# Patient Record
Sex: Male | Born: 1995
Health system: Southern US, Community
[De-identification: ages and names within clinical notes are randomized; demographics above are authoritative.]

## PROBLEM LIST (undated history)

## (undated) DIAGNOSIS — J45909 Unspecified asthma, uncomplicated: Secondary | ICD-10-CM

## (undated) DIAGNOSIS — R51 Headache: Secondary | ICD-10-CM

## (undated) DIAGNOSIS — M549 Dorsalgia, unspecified: Secondary | ICD-10-CM

## (undated) DIAGNOSIS — R519 Headache, unspecified: Secondary | ICD-10-CM

## (undated) HISTORY — DX: Headache, unspecified: R51.9

## (undated) HISTORY — DX: Unspecified asthma, uncomplicated: J45.909

## (undated) HISTORY — DX: Headache: R51

## (undated) HISTORY — DX: Dorsalgia, unspecified: M54.9

---
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
## 2016-03-27 ENCOUNTER — Other Ambulatory Visit: Payer: Self-pay | Admitting: Family Medicine

## 2016-03-27 MED ORDER — ALBUTEROL SULFATE HFA 108 (90 BASE) MCG/ACT IN AERS: 2.0000 | INHALATION_SPRAY | RESPIRATORY_TRACT | 6 refills | Status: DC | PRN

## 2016-03-27 NOTE — Telephone Encounter (Signed)
Historical medication. Last seen 06/24/15. Please advise?

## 2016-04-30 ENCOUNTER — Encounter: Payer: 59 | Admitting: Family Medicine

## 2016-10-29 ENCOUNTER — Other Ambulatory Visit: Payer: Self-pay | Admitting: Family Medicine

## 2016-10-29 DIAGNOSIS — M62838 Other muscle spasm: Secondary | ICD-10-CM

## 2016-10-29 MED ORDER — GABAPENTIN 100 MG PO CAPS: 100.0000 mg | ORAL_CAPSULE | Freq: Three times a day (TID) | ORAL | 3 refills | Status: DC

## 2016-10-29 MED ORDER — CYCLOBENZAPRINE HCL 5 MG PO TABS: 5.0000 mg | ORAL_TABLET | Freq: Three times a day (TID) | ORAL | 0 refills | Status: DC | PRN

## 2017-12-20 ENCOUNTER — Other Ambulatory Visit: Payer: Self-pay | Admitting: Family Medicine

## 2017-12-20 DIAGNOSIS — M62838 Other muscle spasm: Secondary | ICD-10-CM

## 2017-12-20 MED ORDER — ESCITALOPRAM OXALATE 10 MG PO TABS: 10.0000 mg | ORAL_TABLET | Freq: Every day | ORAL | 1 refills | Status: DC

## 2017-12-20 MED ORDER — GABAPENTIN 100 MG PO CAPS: 100.0000 mg | ORAL_CAPSULE | Freq: Three times a day (TID) | ORAL | 3 refills | Status: DC

## 2018-05-26 ENCOUNTER — Other Ambulatory Visit: Payer: Self-pay | Admitting: Family Medicine

## 2018-05-26 MED ORDER — BECLOMETHASONE DIPROPIONATE 80 MCG/ACT IN AERS: 2.0000 | INHALATION_SPRAY | Freq: Two times a day (BID) | RESPIRATORY_TRACT | 12 refills | Status: DC

## 2018-05-26 MED ORDER — ALBUTEROL SULFATE HFA 108 (90 BASE) MCG/ACT IN AERS: 2.0000 | INHALATION_SPRAY | Freq: Four times a day (QID) | RESPIRATORY_TRACT | 11 refills | Status: DC | PRN

## 2018-09-17 ENCOUNTER — Other Ambulatory Visit: Payer: Self-pay | Admitting: Family Medicine

## 2018-09-17 MED ORDER — BUDESONIDE-FORMOTEROL FUMARATE 160-4.5 MCG/ACT IN AERO: 2.0000 | INHALATION_SPRAY | Freq: Two times a day (BID) | RESPIRATORY_TRACT | 3 refills | Status: DC

## 2018-10-09 ENCOUNTER — Other Ambulatory Visit: Payer: Self-pay

## 2018-10-09 MED ORDER — VALACYCLOVIR HCL 1 G PO TABS: 1000.0000 mg | ORAL_TABLET | Freq: Two times a day (BID) | ORAL | 2 refills | Status: DC

## 2018-10-22 NOTE — Progress Notes (Signed)
Subjective:    Christopher White is a 23 y.o. male who presents today for his Complete Annual Exam.    Current Outpatient Medications:  .  budesonide-formoterol (SYMBICORT) 160-4.5 MCG/ACT inhaler, Inhale 2 puffs into the lungs 2 (two) times daily., Disp: 1 Inhaler, Rfl: 3 .  albuterol (VENTOLIN HFA) 108 (90 Base) MCG/ACT inhaler, Inhale 2 puffs into the lungs every 6 (six) hours as needed for wheezing or shortness of breath., Disp: 18 g, Rfl: 3 .  Cetirizine HCl 10 MG CAPS, Take by mouth., Disp: , Rfl:  .  cyclobenzaprine (FLEXERIL) 5 MG tablet, Take 1 tablet (5 mg total) by mouth 3 (three) times daily as needed for muscle spasms., Disp: 20 tablet, Rfl: 0 .  gabapentin (NEURONTIN) 100 MG capsule, Take 1 capsule (100 mg total) by mouth 3 (three) times daily., Disp: 90 capsule, Rfl: 3 .  meloxicam (MOBIC) 15 MG tablet, Take 1 tablet (15 mg total) by mouth daily., Disp: 30 tablet, Rfl: 0 .  traZODone (DESYREL) 50 MG tablet, Take 0.5-1 tablets (25-50 mg total) by mouth at bedtime as needed for sleep., Disp: 30 tablet, Rfl: 3 .  triamcinolone (KENALOG) 0.1 % paste, Use as directed 1 application in the mouth or throat 2 (two) times daily., Disp: 5 g, Rfl: 12 .  valACYclovir (VALTREX) 1000 MG tablet, Take 1 tablet (1,000 mg total) by mouth 2 (two) times daily., Disp: 6 tablet, Rfl: 2  Health Maintenance Due  Topic Date Due  . TETANUS/TDAP  02/20/2016    PMHx, SurgHx, SocialHx, Medications, and Allergies were reviewed in the Visit Navigator and updated as appropriate.   Past Medical History:  Diagnosis Date  . Asthma   . Frequent headaches      Past Surgical History:  Procedure Laterality Date  . NECK SURGERY  2016   C1-C2 fusion for os odontoideum     Family History  Problem Relation Age of Onset  . Heart disease Father   . Hypertension Father   . Anemia Mother     Social History   Tobacco Use  . Smoking status: Current Some Day Smoker  . Smokeless tobacco: Former  Network engineer Use Topics  . Alcohol use: Yes    Alcohol/week: 0.0 standard drinks  . Drug use: No    Review of Systems:   Pertinent items are noted in the HPI. Otherwise, ROS is negative.  Objective:   Vitals:   10/24/18 1435  BP: 110/80  Pulse: 87  Temp: 98 F (36.7 C)  SpO2: 98%   Body mass index is 21.84 kg/m.  General Appearance:  Alert, cooperative, no distress, appears stated age  Head:  Normocephalic, without obvious abnormality, atraumatic  Eyes:  PERRL, conjunctiva/corneas clear, EOM's intact, fundi benign, both eyes       Ears:  Normal TM's and external ear canals, both ears  Nose: Nares normal, septum midline, mucosa normal, no drainage    or sinus tenderness  Throat: Lips, mucosa, and tongue normal; teeth and gums normal  Neck: Supple, symmetrical, trachea midline, no adenopathy; thyroid:  No enlargement/tenderness/nodules; no carotit bruit or JVD  Back:   Symmetric, no curvature, ROM normal, no CVA tenderness  Lungs:   Clear to auscultation bilaterally, respirations unlabored  Chest wall:  No tenderness or deformity  Heart:  Regular rate and rhythm, S1 and S2 normal, no murmur, rub   or gallop  Abdomen:   Soft, non-tender, bowel sounds active all four quadrants, no masses, no organomegaly  Extremities: Extremities normal, atraumatic, no cyanosis or edema  Prostate: Not done.   Skin: Skin color, texture, turgor normal, no rashes or lesions  Lymph nodes: Cervical, supraclavicular, and axillary nodes normal  Neurologic: CNII-XII grossly intact. Normal strength, sensation and reflexes throughout   Assessment/Plan:   Christopher White was seen today for annual exam.  Diagnoses and all orders for this visit:  Routine physical examination  Screening for STD (sexually transmitted disease) -     Cancel: CBC with Differential/Platelet -     Cancel: Comprehensive metabolic panel -     HIV Antibody (routine testing w rflx) -     RPR -     Cervicovaginal ancillary only(  [place]) -     HSV(herpes simplex vrs) 1+2 ab-IgG -     CBC with Differential/Platelet -     Comprehensive metabolic panel  Os odontoideum  Screening for lipid disorders -     Cancel: Lipid panel -     Lipid panel  Cough -     DG Chest 2 View  Primary insomnia -     traZODone (DESYREL) 50 MG tablet; Take 0.5-1 tablets (25-50 mg total) by mouth at bedtime as needed for sleep.  Neck muscle spasm -     meloxicam (MOBIC) 15 MG tablet; Take 1 tablet (15 mg total) by mouth daily. -     gabapentin (NEURONTIN) 100 MG capsule; Take 1 capsule (100 mg total) by mouth 3 (three) times daily. -     cyclobenzaprine (FLEXERIL) 5 MG tablet; Take 1 tablet (5 mg total) by mouth 3 (three) times daily as needed for muscle spasms.  Oral ulcer -     triamcinolone (KENALOG) 0.1 % paste; Use as directed 1 application in the mouth or throat 2 (two) times daily. -     valACYclovir (VALTREX) 1000 MG tablet; Take 1 tablet (1,000 mg total) by mouth 2 (two) times daily.  Moderate persistent asthma without complication -     albuterol (VENTOLIN HFA) 108 (90 Base) MCG/ACT inhaler; Inhale 2 puffs into the lungs every 6 (six) hours as needed for wheezing or shortness of breath.    Patient Counseling: [x]   Nutrition: Stressed importance of moderation in sodium/caffeine intake, saturated fat and cholesterol, caloric balance, sufficient intake of fresh fruits, vegetables, and fiber.  [x]   Stressed the importance of regular exercise.   []   Substance Abuse: Discussed cessation/primary prevention of tobacco, alcohol, or other drug use; driving or other dangerous activities under the influence; availability of treatment for abuse.   [x]   Injury prevention: Discussed safety belts, safety helmets, smoke detector, smoking near bedding or upholstery.   []   Sexuality: Discussed sexually transmitted diseases, partner selection, use of condoms, avoidance of unintended pregnancy and contraceptive alternatives.   [x]   Dental  health: Discussed importance of regular tooth brushing, flossing, and dental visits.  [x]   Health maintenance and immunizations reviewed. Please refer to Health maintenance section.    Christopher RimaErica Deliah Strehlow, DO [place] [facility]

## 2018-10-24 ENCOUNTER — Ambulatory Visit (INDEPENDENT_AMBULATORY_CARE_PROVIDER_SITE_OTHER): Payer: No Typology Code available for payment source | Admitting: Family Medicine

## 2018-10-24 ENCOUNTER — Other Ambulatory Visit: Payer: Self-pay

## 2018-10-24 ENCOUNTER — Encounter: Payer: Self-pay | Admitting: Family Medicine

## 2018-10-24 ENCOUNTER — Other Ambulatory Visit (HOSPITAL_COMMUNITY)
Admission: RE | Admit: 2018-10-24 | Discharge: 2018-10-24 | Disposition: A | Discharge status: Home / Self Care | Payer: No Typology Code available for payment source | Source: Ambulatory Visit | Attending: Family Medicine | Admitting: Family Medicine

## 2018-10-24 VITALS — BP 110/80 | HR 87 | Temp 98.0°F | Ht 72.0 in | Wt 161.0 lb

## 2018-10-24 DIAGNOSIS — Z113 Encounter for screening for infections with a predominantly sexual mode of transmission: Secondary | ICD-10-CM | POA: Insufficient documentation

## 2018-10-24 DIAGNOSIS — M438X1 Other specified deforming dorsopathies, occipito-atlanto-axial region: Secondary | ICD-10-CM | POA: Diagnosis not present

## 2018-10-24 DIAGNOSIS — R05 Cough: Secondary | ICD-10-CM

## 2018-10-24 DIAGNOSIS — F5101 Primary insomnia: Secondary | ICD-10-CM

## 2018-10-24 DIAGNOSIS — Z0000 Encounter for general adult medical examination without abnormal findings: Secondary | ICD-10-CM | POA: Diagnosis not present

## 2018-10-24 DIAGNOSIS — Z13220 Encounter for screening for lipoid disorders: Secondary | ICD-10-CM | POA: Diagnosis not present

## 2018-10-24 DIAGNOSIS — R059 Cough, unspecified: Secondary | ICD-10-CM

## 2018-10-24 DIAGNOSIS — K121 Other forms of stomatitis: Secondary | ICD-10-CM

## 2018-10-24 DIAGNOSIS — J4540 Moderate persistent asthma, uncomplicated: Secondary | ICD-10-CM

## 2018-10-24 DIAGNOSIS — M62838 Other muscle spasm: Secondary | ICD-10-CM

## 2018-10-24 MED ORDER — VALACYCLOVIR HCL 1 G PO TABS: 1000.0000 mg | ORAL_TABLET | Freq: Two times a day (BID) | ORAL | 2 refills | Status: DC

## 2018-10-24 MED ORDER — TRAZODONE HCL 50 MG PO TABS: 25.0000 mg | ORAL_TABLET | Freq: Every evening | ORAL | 3 refills | Status: DC | PRN

## 2018-10-24 MED ORDER — CYCLOBENZAPRINE HCL 5 MG PO TABS: 5.0000 mg | ORAL_TABLET | Freq: Three times a day (TID) | ORAL | 0 refills | Status: DC | PRN

## 2018-10-24 MED ORDER — TRIAMCINOLONE ACETONIDE 0.1 % MT PSTE: 1.0000 "application " | PASTE | Freq: Two times a day (BID) | OROMUCOSAL | 12 refills | Status: DC

## 2018-10-24 MED ORDER — ALBUTEROL SULFATE HFA 108 (90 BASE) MCG/ACT IN AERS: 2.0000 | INHALATION_SPRAY | Freq: Four times a day (QID) | RESPIRATORY_TRACT | 3 refills | Status: DC | PRN

## 2018-10-24 MED ORDER — MELOXICAM 15 MG PO TABS: 15.0000 mg | ORAL_TABLET | Freq: Every day | ORAL | 0 refills | Status: DC

## 2018-10-24 MED ORDER — GABAPENTIN 100 MG PO CAPS: 100.0000 mg | ORAL_CAPSULE | Freq: Three times a day (TID) | ORAL | 3 refills | Status: DC

## 2018-10-25 ENCOUNTER — Encounter: Payer: Self-pay | Admitting: Family Medicine

## 2018-10-25 LAB — CBC WITH DIFFERENTIAL/PLATELET
Absolute Monocytes: 390 cells/uL (ref 200–950)
Basophils Absolute: 18 cells/uL (ref 0–200)
Basophils Relative: 0.3 %
Eosinophils Absolute: 198 cells/uL (ref 15–500)
Eosinophils Relative: 3.3 %
HCT: 47 % (ref 38.5–50.0)
Hemoglobin: 15.8 g/dL (ref 13.2–17.1)
Lymphs Abs: 1452 cells/uL (ref 850–3900)
MCH: 29.8 pg (ref 27.0–33.0)
MCHC: 33.6 g/dL (ref 32.0–36.0)
MCV: 88.5 fL (ref 80.0–100.0)
MPV: 10.5 fL (ref 7.5–12.5)
Monocytes Relative: 6.5 %
Neutro Abs: 3942 cells/uL (ref 1500–7800)
Neutrophils Relative %: 65.7 %
Platelets: 221 10*3/uL (ref 140–400)
RBC: 5.31 10*6/uL (ref 4.20–5.80)
RDW: 12.4 % (ref 11.0–15.0)
Total Lymphocyte: 24.2 %
WBC: 6 10*3/uL (ref 3.8–10.8)

## 2018-10-25 LAB — COMPREHENSIVE METABOLIC PANEL
AG Ratio: 2 (calc) (ref 1.0–2.5)
ALT: 16 U/L (ref 9–46)
AST: 22 U/L (ref 10–40)
Albumin: 4.5 g/dL (ref 3.6–5.1)
Alkaline phosphatase (APISO): 68 U/L (ref 36–130)
BUN: 19 mg/dL (ref 7–25)
CO2: 25 mmol/L (ref 20–32)
Calcium: 9.6 mg/dL (ref 8.6–10.3)
Chloride: 104 mmol/L (ref 98–110)
Creat: 1.02 mg/dL (ref 0.60–1.35)
Globulin: 2.2 g/dL (calc) (ref 1.9–3.7)
Glucose, Bld: 88 mg/dL (ref 65–99)
Potassium: 4.1 mmol/L (ref 3.5–5.3)
Sodium: 140 mmol/L (ref 135–146)
Total Bilirubin: 0.5 mg/dL (ref 0.2–1.2)
Total Protein: 6.7 g/dL (ref 6.1–8.1)

## 2018-10-25 LAB — LIPID PANEL
Cholesterol: 136 mg/dL (ref <200)
HDL: 34 mg/dL — ABNORMAL LOW (ref >=40)
LDL Cholesterol (Calc): 78 mg/dL (calc)
Non-HDL Cholesterol (Calc): 102 mg/dL (calc) (ref <130)
Total CHOL/HDL Ratio: 4 (calc) (ref <5.0)
Triglycerides: 137 mg/dL (ref <150)

## 2018-10-27 LAB — HSV(HERPES SIMPLEX VRS) I + II AB-IGG
HAV 1 IGG,TYPE SPECIFIC AB: 47.6 index — ABNORMAL HIGH
HSV 2 IGG,TYPE SPECIFIC AB: <0.9 index

## 2018-10-27 LAB — RPR: RPR Ser Ql: NONREACTIVE

## 2018-10-27 LAB — HIV ANTIBODY (ROUTINE TESTING W REFLEX): HIV 1&2 Ab, 4th Generation: NONREACTIVE

## 2018-10-28 LAB — CERVICOVAGINAL ANCILLARY ONLY
Chlamydia: NEGATIVE
Neisseria Gonorrhea: NEGATIVE
Trichomonas: NEGATIVE

## 2018-12-22 ENCOUNTER — Other Ambulatory Visit: Payer: Self-pay | Admitting: Family Medicine

## 2018-12-22 DIAGNOSIS — K121 Other forms of stomatitis: Secondary | ICD-10-CM

## 2018-12-22 MED ORDER — VALACYCLOVIR HCL 1 G PO TABS: 1000.0000 mg | ORAL_TABLET | Freq: Two times a day (BID) | ORAL | 11 refills | Status: DC

## 2019-01-19 ENCOUNTER — Other Ambulatory Visit: Payer: Self-pay

## 2019-01-19 DIAGNOSIS — Z20822 Contact with and (suspected) exposure to covid-19: Secondary | ICD-10-CM

## 2019-01-20 LAB — NOVEL CORONAVIRUS, NAA: SARS-CoV-2, NAA: NOT DETECTED

## 2019-03-06 ENCOUNTER — Other Ambulatory Visit: Payer: Self-pay

## 2019-03-06 ENCOUNTER — Emergency Department
Admission: EM | Admit: 2019-03-06 | Discharge: 2019-03-06 | Disposition: A | Discharge status: Home / Self Care | Payer: No Typology Code available for payment source | Attending: Emergency Medicine | Admitting: Emergency Medicine

## 2019-03-06 DIAGNOSIS — M436 Torticollis: Secondary | ICD-10-CM | POA: Insufficient documentation

## 2019-03-06 DIAGNOSIS — J45909 Unspecified asthma, uncomplicated: Secondary | ICD-10-CM | POA: Diagnosis not present

## 2019-03-06 DIAGNOSIS — F17210 Nicotine dependence, cigarettes, uncomplicated: Secondary | ICD-10-CM | POA: Diagnosis not present

## 2019-03-06 DIAGNOSIS — M542 Cervicalgia: Secondary | ICD-10-CM | POA: Diagnosis present

## 2019-03-06 DIAGNOSIS — Z79899 Other long term (current) drug therapy: Secondary | ICD-10-CM | POA: Insufficient documentation

## 2019-03-06 MED ORDER — OXYCODONE-ACETAMINOPHEN 5-325 MG PO TABS
1.0000 | ORAL_TABLET | Freq: Once | ORAL | Status: AC
  Administered 2019-03-06: 1 via ORAL
  Filled 2019-03-06: qty 1

## 2019-03-06 MED ORDER — IBUPROFEN 800 MG PO TABS: 800.0000 mg | ORAL_TABLET | Freq: Three times a day (TID) | ORAL | 0 refills | Status: DC | PRN

## 2019-03-06 MED ORDER — DIAZEPAM 2 MG PO TABS
2.0000 mg | ORAL_TABLET | Freq: Once | ORAL | Status: AC
  Administered 2019-03-06: 06:00:00 2 mg via ORAL
  Filled 2019-03-06: qty 1

## 2019-03-06 MED ORDER — DIAZEPAM 2 MG PO TABS: 2.0000 mg | ORAL_TABLET | Freq: Three times a day (TID) | ORAL | 0 refills | Status: DC | PRN

## 2019-03-06 MED ORDER — OXYCODONE-ACETAMINOPHEN 5-325 MG PO TABS: 1.0000 | ORAL_TABLET | ORAL | 0 refills | Status: DC | PRN

## 2019-03-06 MED ORDER — KETOROLAC TROMETHAMINE 30 MG/ML IJ SOLN
30.0000 mg | Freq: Once | INTRAMUSCULAR | Status: AC
  Administered 2019-03-06: 30 mg via INTRAMUSCULAR
  Filled 2019-03-06: qty 1

## 2019-03-06 NOTE — ED Triage Notes (Signed)
Pt states neck pain began this AM. States played football yesterday but denies falling or getting hit. States "i'm pretty active, I usually play baseball." A&O, ambulatory. Holding neck at this time. Hx neck surgery either in 2015 or 2017.

## 2019-03-06 NOTE — Discharge Instructions (Signed)
1. You may take medicines as needed for pain and muscle spasms (Motrin/Percocet/Valium #15). °2. Apply moist heat to affected area several times daily. °3. Return to the ER for worsening symptoms, persistent vomiting, difficulty breathing or other concerns. °

## 2019-03-06 NOTE — ED Notes (Signed)
Spoke to Dr. Beather Arbour about pt presentation. No blood work at this time.
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
## 2019-04-14 ENCOUNTER — Other Ambulatory Visit: Payer: Self-pay | Admitting: Family Medicine

## 2019-04-14 DIAGNOSIS — J4540 Moderate persistent asthma, uncomplicated: Secondary | ICD-10-CM

## 2019-04-14 MED ORDER — BUDESONIDE-FORMOTEROL FUMARATE 160-4.5 MCG/ACT IN AERO: 2.0000 | INHALATION_SPRAY | Freq: Two times a day (BID) | RESPIRATORY_TRACT | 11 refills | Status: DC

## 2019-04-14 MED ORDER — ALBUTEROL SULFATE HFA 108 (90 BASE) MCG/ACT IN AERS: 2.0000 | INHALATION_SPRAY | Freq: Four times a day (QID) | RESPIRATORY_TRACT | 11 refills | Status: DC | PRN

## 2019-04-25 ENCOUNTER — Emergency Department
Admission: EM | Admit: 2019-04-25 | Discharge: 2019-04-25 | Disposition: A | Discharge status: Home / Self Care | Payer: No Typology Code available for payment source | Attending: Emergency Medicine | Admitting: Emergency Medicine

## 2019-04-25 ENCOUNTER — Emergency Department: Payer: No Typology Code available for payment source

## 2019-04-25 ENCOUNTER — Other Ambulatory Visit: Payer: Self-pay

## 2019-04-25 DIAGNOSIS — Z79899 Other long term (current) drug therapy: Secondary | ICD-10-CM | POA: Insufficient documentation

## 2019-04-25 DIAGNOSIS — X500XXA Overexertion from strenuous movement or load, initial encounter: Secondary | ICD-10-CM | POA: Insufficient documentation

## 2019-04-25 DIAGNOSIS — F17200 Nicotine dependence, unspecified, uncomplicated: Secondary | ICD-10-CM | POA: Diagnosis not present

## 2019-04-25 DIAGNOSIS — J45909 Unspecified asthma, uncomplicated: Secondary | ICD-10-CM | POA: Insufficient documentation

## 2019-04-25 DIAGNOSIS — Y999 Unspecified external cause status: Secondary | ICD-10-CM | POA: Diagnosis not present

## 2019-04-25 DIAGNOSIS — Y9389 Activity, other specified: Secondary | ICD-10-CM | POA: Diagnosis not present

## 2019-04-25 DIAGNOSIS — T148XXA Other injury of unspecified body region, initial encounter: Secondary | ICD-10-CM

## 2019-04-25 DIAGNOSIS — Y9289 Other specified places as the place of occurrence of the external cause: Secondary | ICD-10-CM | POA: Diagnosis not present

## 2019-04-25 DIAGNOSIS — S3992XA Unspecified injury of lower back, initial encounter: Secondary | ICD-10-CM | POA: Diagnosis present

## 2019-04-25 DIAGNOSIS — M4306 Spondylolysis, lumbar region: Secondary | ICD-10-CM | POA: Insufficient documentation

## 2019-04-25 DIAGNOSIS — S39012A Strain of muscle, fascia and tendon of lower back, initial encounter: Secondary | ICD-10-CM | POA: Diagnosis not present

## 2019-04-25 MED ORDER — KETOROLAC TROMETHAMINE 30 MG/ML IJ SOLN
30.0000 mg | Freq: Once | INTRAMUSCULAR | Status: AC
  Administered 2019-04-25: 30 mg via INTRAMUSCULAR
  Filled 2019-04-25: qty 1

## 2019-04-25 MED ORDER — METHOCARBAMOL 500 MG PO TABS: 500.0000 mg | ORAL_TABLET | Freq: Four times a day (QID) | ORAL | 0 refills | Status: DC

## 2019-04-25 MED ORDER — ONDANSETRON 4 MG PO TBDP
4.0000 mg | ORAL_TABLET | Freq: Once | ORAL | Status: AC
  Administered 2019-04-25: 4 mg via ORAL
  Filled 2019-04-25: qty 1

## 2019-04-25 MED ORDER — CYCLOBENZAPRINE HCL 10 MG PO TABS
10.0000 mg | ORAL_TABLET | Freq: Once | ORAL | Status: AC
  Administered 2019-04-25: 10 mg via ORAL
  Filled 2019-04-25: qty 1

## 2019-04-25 NOTE — Discharge Instructions (Addendum)
Follow-up with your regular doctor if not better in 3 to 5 days.  Use the medication as prescribed.  Take Tylenol and ibuprofen with this medication to decrease the amount of pain.  Return if worsening.

## 2019-04-25 NOTE — ED Provider Notes (Signed)
[facility] Emergency Department Provider Note  ____________________________________________   First MD Initiated Contact with Patient 04/25/19 1449     (approximate)  I have reviewed the triage vital signs and the nursing notes.   HISTORY  Chief Complaint Back Pain    HPI Christopher White is a 24 y.o. male presents emergency department complaining of low back pain.  States he was picking up trash bags at work and felt a sharp pain.  When he got in his car got worse.  He states it is bringing him to his knees it hurts so bad.  No numbness or tingling.  No fall.    Past Medical History:  Diagnosis Date  . Asthma   . Frequent headaches     Patient Active Problem List   Diagnosis Date Noted  . 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 Screening for STD (sexually transmitted disease) 06/24/2015    Past Surgical History:  Procedure Laterality Date  . NECK SURGERY  2016   C1-C2 fusion for os odontoideum    Prior to Admission medications   Medication Sig Start Date End Date Taking? Authorizing Provider  albuterol (VENTOLIN HFA) 108 (90 Base) MCG/ACT inhaler Inhale 2 puffs into the lungs every 6 (six) hours as needed for wheezing or shortness of breath. 04/14/19   Briscoe Deutscher, DO  budesonide-formoterol (SYMBICORT) 160-4.5 MCG/ACT inhaler Inhale 2 puffs into the lungs 2 (two) times daily. 04/14/19   Briscoe Deutscher, DO  Cetirizine HCl 10 MG CAPS Take by mouth.    [provider]  cyclobenzaprine (FLEXERIL) 5 MG tablet Take 1 tablet (5 mg total) by mouth 3 (three) times daily as needed for muscle spasms. 10/24/18   Briscoe Deutscher, DO  diazepam (VALIUM) 2 MG tablet Take 1 tablet (2 mg total) by mouth every 8 (eight) hours as needed for muscle spasms. 03/06/19   Paulette Blanch, MD  gabapentin (NEURONTIN) 100 MG capsule Take 1 capsule (100 mg total) by mouth 3 (three) times daily. 10/24/18   Briscoe Deutscher, DO  ibuprofen (ADVIL) 800 MG tablet Take 1 tablet (800  mg total) by mouth every 8 (eight) hours as needed for moderate pain. 03/06/19   Paulette Blanch, MD  meloxicam (MOBIC) 15 MG tablet Take 1 tablet (15 mg total) by mouth daily. 10/24/18   Briscoe Deutscher, DO  methocarbamol (ROBAXIN) 500 MG tablet Take 1 tablet (500 mg total) by mouth 4 (four) times daily. 04/25/19   Akash Winski, Linden Dolin, PA-C  oxyCODONE-acetaminophen (PERCOCET/ROXICET) 5-325 MG tablet Take 1 tablet by mouth every 4 (four) hours as needed for severe pain. 03/06/19   Paulette Blanch, MD  traZODone (DESYREL) 50 MG tablet Take 0.5-1 tablets (25-50 mg total) by mouth at bedtime as needed for sleep. 10/24/18   Briscoe Deutscher, DO  triamcinolone (KENALOG) 0.1 % paste Use as directed 1 application in the mouth or throat 2 (two) times daily. 10/24/18   Briscoe Deutscher, DO  valACYclovir (VALTREX) 1000 MG tablet Take 1 tablet (1,000 mg total) by mouth 2 (two) times daily. 12/22/18   Briscoe Deutscher, DO    Allergies Patient has no known allergies.  Family History  Problem Relation Age of Onset  . Heart disease Father   . Hypertension Father   . Anemia Mother     Social History Social History   Tobacco Use  . Smoking status: Current Some Day Smoker  . Smokeless tobacco: Former Network engineer Use Topics  . Alcohol use: Yes  Alcohol/week: 0.0 standard drinks  . Drug use: No    Review of Systems  Constitutional: No fever/chills Eyes: No visual changes. ENT: No sore throat. Respiratory: Denies cough Cardiovascular: Denies chest pain Gastrointestinal: Denies abdominal pain Genitourinary: Negative for dysuria. Musculoskeletal: Positive for back pain. Skin: Negative for rash. Psychiatric: no mood changes,     ____________________________________________   PHYSICAL EXAM:  VITAL SIGNS: ED Triage Vitals  Enc Vitals Group     BP 04/25/19 1404 106/71     Pulse Rate 04/25/19 1404 65     Resp 04/25/19 1404 18     Temp 04/25/19 1404 97.7 F (36.5 C)     Temp Source 04/25/19 1404 Oral      SpO2 04/25/19 1404 100 %     Weight 04/25/19 1406 170 lb (77.1 kg)     Height 04/25/19 1406 6' (1.829 m)     Head Circumference --      Peak Flow --      Pain Score 04/25/19 1406 8     Pain Loc --      Pain Edu? --      Excl. in GC? --     Constitutional: Alert and oriented. Well appearing and in no acute distress. Eyes: Conjunctivae are normal.  Head: Atraumatic. Nose: No congestion/rhinnorhea. Mouth/Throat: Mucous membranes are moist.   Neck:  supple no lymphadenopathy noted Cardiovascular: Normal rate, regular rhythm. Respiratory: Normal respiratory effort.  No retractions,  GU: deferred Musculoskeletal: FROM all extremities, warm and well perfused, large spasm noted up the left side of the lower back, spine is mildly tender, neurovascular is intact, pain is reproduced with any movement. Neurologic:  Normal speech and language.  Skin:  Skin is warm, dry and intact. No rash noted. Psychiatric: Mood and affect are normal. Speech and behavior are normal.  ____________________________________________   LABS (all labs ordered are listed, but only abnormal results are displayed)  Labs Reviewed - No data to display ____________________________________________   ____________________________________________  RADIOLOGY  X-ray of the lumbar spine is negative  ____________________________________________   PROCEDURES  Procedure(s) performed: Toradol 30 mg IM, Flexeril 1 p.o.  Procedures    ____________________________________________   INITIAL IMPRESSION / ASSESSMENT AND PLAN / ED COURSE  Pertinent labs & imaging results that were available during my care of the patient were reviewed by me and considered in my medical decision making (see chart for details).   Patient is 24 year old male presents emergency department complaining of low back pain.  See HPI  Physical exam patient appears to be in fair amount of pain.  Area is tender to palpation.  Discussed  findings with patient.  We will do a x-ray, he was given Toradol 30 mg IM and Flexeril 1 p.o.  X-ray of the lumbar spine is negative  Explained the x-ray results to the patient.  He is to follow-up with orthopedics or his regular doctor if not better in 3 to 5 days.  He was given prescription for Robaxin.  Take Tylenol and ibuprofen for pain as needed.  Use wet heat to massage the muscles.  Return if worsening.  States he understands will comply.  Is discharged stable condition.    Carry Chehalis Bazen was evaluated in Emergency Department on 04/25/2019 for the symptoms described in the history of present illness. He was evaluated in the context of the global COVID-19 pandemic, which necessitated consideration that the patient might be at risk for infection with the SARS-CoV-2 virus that causes COVID-19. Institutional protocols  and algorithms that pertain to the evaluation of patients at risk for COVID-19 are in a state of rapid change based on information released by regulatory bodies including the CDC and federal and state organizations. These policies and algorithms were followed during the patient's care in the ED.   As part of my medical decision making, I reviewed the following data within the electronic MEDICAL RECORD NUMBER Nursing notes reviewed and incorporated, Old chart reviewed, Radiograph reviewed see above, Notes from prior ED visits and [place] Controlled Substance Database  ____________________________________________   FINAL CLINICAL IMPRESSION(S) / ED DIAGNOSES  Final diagnoses:  Muscle strain      NEW MEDICATIONS STARTED DURING THIS VISIT:  New Prescriptions   METHOCARBAMOL (ROBAXIN) 500 MG TABLET    Take 1 tablet (500 mg total) by mouth 4 (four) times daily.     Note:  This document was prepared using Dragon voice recognition software and may include unintentional dictation errors.    Faythe Ghee, PA-C 04/25/19 1549    Phineas Semen, MD 04/26/19 [phone]

## 2019-04-25 NOTE — ED Triage Notes (Signed)
Pt c/o left lower back pain that started while lifting , pt is clammy on arrival.

## 2019-04-29 ENCOUNTER — Ambulatory Visit
Admission: EM | Admit: 2019-04-29 | Discharge: 2019-04-29 | Disposition: A | Discharge status: Home / Self Care | Payer: No Typology Code available for payment source | Attending: Family Medicine | Admitting: Family Medicine

## 2019-04-29 ENCOUNTER — Encounter: Payer: Self-pay | Admitting: Emergency Medicine

## 2019-04-29 ENCOUNTER — Other Ambulatory Visit: Payer: Self-pay

## 2019-04-29 DIAGNOSIS — R519 Headache, unspecified: Secondary | ICD-10-CM | POA: Diagnosis not present

## 2019-04-29 DIAGNOSIS — J45901 Unspecified asthma with (acute) exacerbation: Secondary | ICD-10-CM | POA: Diagnosis not present

## 2019-04-29 DIAGNOSIS — Z87891 Personal history of nicotine dependence: Secondary | ICD-10-CM | POA: Diagnosis not present

## 2019-04-29 DIAGNOSIS — Z20822 Contact with and (suspected) exposure to covid-19: Secondary | ICD-10-CM | POA: Insufficient documentation

## 2019-04-29 DIAGNOSIS — Z8249 Family history of ischemic heart disease and other diseases of the circulatory system: Secondary | ICD-10-CM | POA: Diagnosis not present

## 2019-04-29 DIAGNOSIS — Z832 Family history of diseases of the blood and blood-forming organs and certain disorders involving the immune mechanism: Secondary | ICD-10-CM | POA: Insufficient documentation

## 2019-04-29 DIAGNOSIS — Z7189 Other specified counseling: Secondary | ICD-10-CM

## 2019-04-29 DIAGNOSIS — J069 Acute upper respiratory infection, unspecified: Secondary | ICD-10-CM | POA: Diagnosis not present

## 2019-04-29 DIAGNOSIS — R05 Cough: Secondary | ICD-10-CM | POA: Diagnosis not present

## 2019-04-29 DIAGNOSIS — R0602 Shortness of breath: Secondary | ICD-10-CM | POA: Diagnosis present

## 2019-04-29 LAB — SARS CORONAVIRUS 2 AG (30 MIN TAT): SARS Coronavirus 2 Ag: NEGATIVE

## 2019-04-29 MED ORDER — PREDNISONE 20 MG PO TABS: 40.0000 mg | ORAL_TABLET | Freq: Every day | ORAL | 0 refills | Status: DC

## 2019-04-29 MED ORDER — BENZONATATE 100 MG PO CAPS: 100.0000 mg | ORAL_CAPSULE | Freq: Three times a day (TID) | ORAL | 0 refills | Status: DC | PRN

## 2019-04-29 MED ORDER — ALBUTEROL SULFATE HFA 108 (90 BASE) MCG/ACT IN AERS: 2.0000 | INHALATION_SPRAY | RESPIRATORY_TRACT | 0 refills | Status: DC | PRN

## 2019-04-29 NOTE — ED Triage Notes (Signed)
Patient c/o SOB, headache, sneezing and cough that started 3 days ago. He has had a positive exposure 6 days ago.

## 2019-04-29 NOTE — Discharge Instructions (Signed)
Take medication as prescribed. Over the counter congestion medication as needed. Rest. Drink plenty of fluids.   Follow up with your primary care physician this week as needed. Return to Urgent care for new or worsening concerns.

## 2019-04-29 NOTE — ED Provider Notes (Signed)
[facility] ____________________________________________  Time seen: Approximately 5:32 PM  I have reviewed the triage vital signs and the nursing notes.   HISTORY  Chief Complaint Shortness of Breath, Headache, and Cough   HPI Christopher White is a 24 y.o. male presenting for evaluation of 4 days of runny nose, nasal congestion and cough.  Reports some body aches, denies fevers.  States he has occasionally heard himself wheeze, consistent with his previous asthma flareups.  Did use his albuterol inhaler this morning which helps some.  Denies changes in taste or smell, diarrhea.  1 episode of vomiting this past weekend.  Denies abdominal pain.  Does have some intermittent shortness of breath with chest tightness, consistent with his previous asthma flareups.  Denies chest pain.  Did take some over-the-counter cough congestion medication which helped minimally.  Reports to exposure to COVID-19 in the last week, 1 from her physicals 1 from his girlfriends died.  Denies other aggravating or alleviating factors.    Past Medical History:  Diagnosis Date  . Asthma   . Frequent headaches     Patient Active Problem List   Diagnosis Date Noted  . 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 Screening for STD (sexually transmitted disease) 06/24/2015    Past Surgical History:  Procedure Laterality Date  . NECK SURGERY  2016   C1-C2 fusion for os odontoideum     No current facility-administered medications for this encounter.  Current Outpatient Medications:  .  albuterol (VENTOLIN HFA) 108 (90 Base) MCG/ACT inhaler, Inhale 2 puffs into the lungs every 6 (six) hours as needed for wheezing or shortness of breath., Disp: 18 g, Rfl: 11 .  albuterol (VENTOLIN HFA) 108 (90 Base) MCG/ACT inhaler, Inhale 2 puffs into the lungs every 4 (four) hours as needed., Disp: 6.7 g, Rfl: 0 .  benzonatate (TESSALON PERLES) 100 MG capsule, Take 1 capsule (100 mg total) by mouth 3 (three) times daily  as needed for cough., Disp: 15 capsule, Rfl: 0 .  predniSONE (DELTASONE) 20 MG tablet, Take 2 tablets (40 mg total) by mouth daily., Disp: 10 tablet, Rfl: 0  Allergies Patient has no known allergies.  Family History  Problem Relation Age of Onset  . Heart disease Father   . Hypertension Father   . Anemia Mother     Social History Social History   Tobacco Use  . Smoking status: Former Research scientist (life sciences)  . Smokeless tobacco: Former Network engineer Use Topics  . Alcohol use: Yes    Alcohol/week: 0.0 standard drinks  . Drug use: No    Review of Systems Constitutional: No fever ENT: Reports some scratchy throat.  Positive nasal congestion. Cardiovascular: Denies chest pain. Respiratory: As above. Gastrointestinal: No abdominal pain.  Genitourinary: Negative for dysuria. Musculoskeletal: Negative for back pain. Skin: Negative for rash.   ____________________________________________   PHYSICAL EXAM:  VITAL SIGNS: ED Triage Vitals  Enc Vitals Group     BP 04/29/19 1645 120/88     Pulse Rate 04/29/19 1645 (!) 112     Resp 04/29/19 1645 18     Temp 04/29/19 1645 98.1 F (36.7 C)     Temp Source 04/29/19 1645 Oral     SpO2 04/29/19 1645 99 %     Weight 04/29/19 1641 170 lb (77.1 kg)     Height 04/29/19 1641 6' (1.829 m)     Head Circumference --      Peak Flow --      Pain Score 04/29/19 1640 3  Pain Loc --      Pain Edu? --      Excl. in GC? --     Constitutional: Alert and oriented. Well appearing and in no acute distress. Eyes: Conjunctivae are normal. Head: Atraumatic. No swelling. No erythema.  Nose:Nasal congestion   Mouth/Throat: Mucous membranes are moist. No pharyngeal erythema. No tonsillar swelling or exudate.  Neck: No stridor.  No cervical spine tenderness to palpation. Hematological/Lymphatic/Immunilogical: No cervical lymphadenopathy. Cardiovascular: Normal rate, regular rhythm. Grossly normal heart sounds.  Good peripheral circulation. Respiratory:  Normal respiratory effort.  No retractions.  No rhonchi.  Occasional wheeze.  Dry intermittent cough in room with mild bronchospasm.  Good air movement.  Speaks in complete sentences. Musculoskeletal: Ambulatory with steady gait.  Neurologic:  Normal speech and language. No gait instability. Skin:  Skin appears warm, dry and intact. No rash noted. Psychiatric: Mood and affect are normal. Speech and behavior are normal.  ___________________________________________   LABS (all labs ordered are listed, but only abnormal results are displayed)  Labs Reviewed  SARS CORONAVIRUS 2 AG (30 MIN TAT)  NOVEL CORONAVIRUS, NAA (HOSP ORDER, SEND-OUT TO REF LAB; TAT 18-24 HRS)    PROCEDURES Procedures     INITIAL IMPRESSION / ASSESSMENT AND PLAN / ED COURSE  Pertinent labs & imaging results that were available during my care of the patient were reviewed by me and considered in my medical decision making (see chart for details).  Overall well-appearing patient.  No acute distress.  Suspect viral illness with acute asthma exacerbation.  Rapid Covid test negative, PCR sent.  COVID-19 advice given.  Will refill albuterol inhaler, 5-day course of prednisone prn Tessalon Perles.  Continue over-the-counter decongestants.  Discussed rest, fluids, remaining home and supportive care.  Discussed reevaluation concerns.  Discussed evaluation of chest x-ray today in urgent care, patient declined, stating he will return if symptoms persist.Discussed indication, risks and benefits of medications with patient.   Discussed follow up with Primary care physician this week. Discussed follow up and return parameters including no resolution or any worsening concerns. Patient verbalized understanding and agreed to plan.   ____________________________________________   FINAL CLINICAL IMPRESSION(S) / ED DIAGNOSES  Final diagnoses:  Upper respiratory tract infection, unspecified type  Advice given about COVID-19 virus  infection  Asthma with acute exacerbation, unspecified asthma severity, unspecified whether persistent     ED Discharge Orders         Ordered    predniSONE (DELTASONE) 20 MG tablet  Daily     04/29/19 1716    albuterol (VENTOLIN HFA) 108 (90 Base) MCG/ACT inhaler  Every 4 hours PRN     04/29/19 1716    benzonatate (TESSALON PERLES) 100 MG capsule  3 times daily PRN     04/29/19 1716           Note: This dictation was prepared with Dragon dictation along with smaller phrase technology. Any transcriptional errors that result from this process are unintentional.         Renford Dills, NP 04/29/19 1740

## 2019-04-30 LAB — NOVEL CORONAVIRUS, NAA (HOSP ORDER, SEND-OUT TO REF LAB; TAT 18-24 HRS): SARS-CoV-2, NAA: NOT DETECTED

## 2019-06-01 ENCOUNTER — Encounter: Payer: Self-pay | Admitting: Internal Medicine

## 2019-06-01 ENCOUNTER — Other Ambulatory Visit: Payer: Self-pay

## 2019-06-01 ENCOUNTER — Ambulatory Visit (INDEPENDENT_AMBULATORY_CARE_PROVIDER_SITE_OTHER): Payer: No Typology Code available for payment source | Admitting: Internal Medicine

## 2019-06-01 VITALS — BP 104/68 | HR 80 | Temp 98.1°F | Ht 72.0 in | Wt 167.0 lb

## 2019-06-01 DIAGNOSIS — M62830 Muscle spasm of back: Secondary | ICD-10-CM | POA: Diagnosis not present

## 2019-06-01 DIAGNOSIS — J4540 Moderate persistent asthma, uncomplicated: Secondary | ICD-10-CM

## 2019-06-01 MED ORDER — METAXALONE 400 MG PO TABS: 400.0000 mg | ORAL_TABLET | Freq: Three times a day (TID) | ORAL | 0 refills | Status: AC

## 2019-06-01 MED ORDER — ALBUTEROL SULFATE HFA 108 (90 BASE) MCG/ACT IN AERS: 2.0000 | INHALATION_SPRAY | Freq: Four times a day (QID) | RESPIRATORY_TRACT | 11 refills | Status: DC | PRN

## 2019-06-01 NOTE — Progress Notes (Signed)
Date:  06/01/2019   Name:  Christopher White [facility]   DOB:  Feb 17, 1996   MRN:  [id number]   Chief Complaint: Establish Care (New patient.), Back Pain (Having to take muscle relaxer to make the pain bareable- does not solve the problem or take the pain fully away. ), and Asthma ([facility] [place]. )  Back Pain This is a recurrent problem. The problem occurs every several days. The pain is present in the lumbar spine (muscle spasms on the right lumbar region). The quality of the pain is described as aching. The pain does not radiate. The pain is moderate. Exacerbated by: seems to be worse after work. Pertinent negatives include no abdominal pain, bladder incontinence, bowel incontinence, chest pain, fever, headaches, pelvic pain or tingling. He has tried muscle relaxant, ice and heat (tried robaxin, flexeril; recently added vitamin/mineral supplement, stopped soda and began drinking more water and electrolytes) for the symptoms. The treatment provided mild relief.  Asthma He complains of wheezing. There is no cough. This is a recurrent problem. The problem occurs daily. The problem has been unchanged. Associated symptoms include myalgias. Pertinent negatives include no chest pain, fever, headaches or trouble swallowing. His symptoms are alleviated by beta-agonist (has tried maintenance therapy in the past but stopped due to percieved lack of benefit). His past medical history is significant for asthma.    Lab Results  Component Value Date   CREATININE 1.02 10/24/2018   BUN 19 10/24/2018   NA 140 10/24/2018   K 4.1 10/24/2018   CL 104 10/24/2018   CO2 25 10/24/2018   Lab Results  Component Value Date   CHOL 136 10/24/2018   HDL 34 (L) 10/24/2018   LDLCALC 78 10/24/2018   TRIG 137 10/24/2018   CHOLHDL 4.0 10/24/2018   No results found for: TSH No results found for: HGBA1C   Review of Systems  Constitutional: Negative for chills, fatigue, fever and unexpected weight change.  HENT:  Negative for trouble swallowing.   Respiratory: Positive for wheezing. Negative for cough and chest tightness.   Cardiovascular: Negative for chest pain, palpitations and leg swelling.  Gastrointestinal: Negative for abdominal pain and bowel incontinence.  Genitourinary: Negative for bladder incontinence and pelvic pain.  Musculoskeletal: Positive for back pain and myalgias. Negative for gait problem and joint swelling.  Neurological: Negative for dizziness, tingling and headaches.  Psychiatric/Behavioral: Negative for dysphoric mood and sleep disturbance.    Patient Active Problem List   Diagnosis Date Noted  . 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 Screening for STD (sexually transmitted disease) 06/24/2015    No Known Allergies  Past Surgical History:  Procedure Laterality Date  . NECK SURGERY  2016   C1-C2 fusion for os odontoideum    Social History   Tobacco Use  . Smoking status: Former Smoker    Packs/day: 1.50    Years: 1.50    Pack years: 2.25    Types: Cigarettes    Quit date: 2016    Years since quitting: 5.1  . Smokeless tobacco: Former Systems developer    Types: [place] date: 2016  Substance Use Topics  . Alcohol use: Yes    Alcohol/week: 0.0 standard drinks  . Drug use: No     Medication list has been reviewed and updated.  Current Meds  Medication Sig  . albuterol (VENTOLIN HFA) 108 (90 Base) MCG/ACT inhaler Inhale 2 puffs into the lungs every 6 (six) hours as needed for wheezing or shortness of breath.  Marland Kitchen  methocarbamol (ROBAXIN) 500 MG tablet Take 500 mg by mouth every 8 (eight) hours as needed for muscle spasms.    PHQ 2/9 Scores 06/01/2019 10/24/2018  PHQ - 2 Score 0 0  PHQ- 9 Score - 3    BP Readings from Last 3 Encounters:  06/01/19 104/68  04/29/19 120/88  04/25/19 (!) 141/87     Physical Exam Vitals and nursing note reviewed.  Constitutional:      General: He is not in acute distress.    Appearance: Normal appearance. He is well-developed.   HENT:     Head: Normocephalic and atraumatic.  Cardiovascular:     Rate and Rhythm: Normal rate and regular rhythm.     Pulses: Normal pulses.  Pulmonary:     Effort: Pulmonary effort is normal. No respiratory distress.     Breath sounds: No wheezing or rhonchi.  Musculoskeletal:     Lumbar back: Spasms present. No bony tenderness. Normal range of motion. No scoliosis.       Back:     Right lower leg: No edema.     Left lower leg: No edema.  Lymphadenopathy:     Cervical: No cervical adenopathy.  Skin:    General: Skin is warm and dry.     Findings: No rash.  Neurological:     Mental Status: He is alert and oriented to person, place, and time.  Psychiatric:        Behavior: Behavior normal.        Thought Content: Thought content normal.     Wt Readings from Last 3 Encounters:  06/01/19 167 lb (75.8 kg)  04/29/19 170 lb (77.1 kg)  04/25/19 170 lb (77.1 kg)    BP 104/68   Pulse 80   Temp 98.1 F (36.7 C) (Oral)   Ht 6' (1.829 m)   Wt 167 lb (75.8 kg)   SpO2 97%   BMI 22.65 kg/m   Assessment and Plan: 1. Muscle spasm of back May try chiropractic care or massage Continue Multi-vitamin and mineral; may try extra magnesium Maintain hydration with water and electrolyte waters Heat or ice - maybe heated seat while driving home from [facility] Use muscle relaxant in the evening - metaxalone (SKELAXIN) 400 MG tablet; Take 1-2 tablets (400-800 mg total) by mouth 3 (three) times daily.  Dispense: 60 tablet; Refill: 0  2. Moderate persistent asthma without complication Sample of Dulera 200 mcg given - one puff twice a day Call for a prescription if helpful - albuterol (VENTOLIN HFA) 108 (90 Base) MCG/ACT inhaler; Inhale 2 puffs into the lungs every 6 (six) hours as needed for wheezing or shortness of breath.  Dispense: 18 g; Refill: 11   Partially dictated using Animal nutritionist. Any errors are unintentional.  Bari Edward, MD [facility]  06/01/2019

## 2019-06-01 NOTE — Patient Instructions (Signed)
Dulera - one puff twice a day 

## 2019-12-03 ENCOUNTER — Ambulatory Visit
Admission: EM | Admit: 2019-12-03 | Discharge: 2019-12-03 | Disposition: A | Discharge status: Home / Self Care | Payer: No Typology Code available for payment source | Attending: Family Medicine | Admitting: Family Medicine

## 2019-12-03 ENCOUNTER — Other Ambulatory Visit: Payer: Self-pay

## 2019-12-03 DIAGNOSIS — Z20822 Contact with and (suspected) exposure to covid-19: Secondary | ICD-10-CM | POA: Diagnosis not present

## 2019-12-03 NOTE — Discharge Instructions (Signed)

## 2019-12-03 NOTE — ED Triage Notes (Signed)
Patient in today after being exposed to covid yesterday. Patient denies any symptoms. Patient here for covid testing only.

## 2019-12-04 LAB — SARS CORONAVIRUS 2 (TAT 6-24 HRS): SARS Coronavirus 2: NEGATIVE

## 2019-12-08 ENCOUNTER — Other Ambulatory Visit: Payer: Self-pay

## 2019-12-08 ENCOUNTER — Encounter: Payer: Self-pay | Admitting: Emergency Medicine

## 2019-12-08 ENCOUNTER — Ambulatory Visit
Admission: EM | Admit: 2019-12-08 | Discharge: 2019-12-08 | Disposition: A | Discharge status: Home / Self Care | Payer: No Typology Code available for payment source | Attending: Emergency Medicine | Admitting: Emergency Medicine

## 2019-12-08 DIAGNOSIS — M62830 Muscle spasm of back: Secondary | ICD-10-CM | POA: Insufficient documentation

## 2019-12-08 LAB — BASIC METABOLIC PANEL
Anion gap: 9 (ref 5–15)
BUN: 12 mg/dL (ref 6–20)
CO2: 28 mmol/L (ref 22–32)
Calcium: 9.4 mg/dL (ref 8.9–10.3)
Chloride: 104 mmol/L (ref 98–111)
Creatinine, Ser: 0.91 mg/dL (ref 0.61–1.24)
GFR calc Af Amer: >60 mL/min (ref >60)
GFR calc non Af Amer: >60 mL/min (ref >60)
Glucose, Bld: 94 mg/dL (ref 70–99)
Potassium: 3.7 mmol/L (ref 3.5–5.1)
Sodium: 141 mmol/L (ref 135–145)

## 2019-12-08 LAB — MAGNESIUM: Magnesium: 2.1 mg/dL (ref 1.7–2.4)

## 2019-12-08 MED ORDER — ACETAMINOPHEN 500 MG PO TABS
1000.0000 mg | ORAL_TABLET | Freq: Once | ORAL | Status: AC
  Administered 2019-12-08: 1000 mg via ORAL

## 2019-12-08 MED ORDER — IBUPROFEN 600 MG PO TABS: 600.0000 mg | ORAL_TABLET | Freq: Four times a day (QID) | ORAL | 0 refills | Status: DC | PRN

## 2019-12-08 MED ORDER — METAXALONE 800 MG PO TABS: 800.0000 mg | ORAL_TABLET | Freq: Three times a day (TID) | ORAL | 0 refills | Status: DC

## 2019-12-08 MED ORDER — METHYLPREDNISOLONE 4 MG PO TBPK: ORAL_TABLET | Freq: Every day | ORAL | 0 refills | Status: DC

## 2019-12-08 MED ORDER — KETOROLAC TROMETHAMINE 60 MG/2ML IM SOLN
30.0000 mg | Freq: Once | INTRAMUSCULAR | Status: AC
  Administered 2019-12-08: 30 mg via INTRAMUSCULAR

## 2019-12-08 NOTE — ED Triage Notes (Signed)
Patient c/o muscle spasm in his back that started this morning.

## 2019-12-08 NOTE — Discharge Instructions (Addendum)
I will contact you at [phone] only if your labs are abnormal.  If you do not hear from me by tomorrow, then you can assume everything is fine.  Take 600 mg of ibuprofen combined with 1000 mg of Tylenol together 3-4 times a day as needed for pain and inflammation.  Skelaxin for muscle spasms.  Try the Medrol Dosepak.  I would add magnesium supplement to your multivitamin.  It may give you diarrhea.  Deep tissue massage, gentle stretching, yoga can be helpful.

## 2019-12-08 NOTE — ED Provider Notes (Signed)
HPI  SUBJECTIVE:  Christopher White is a 24 y.o. male who presents with sharp hours long back muscle spasms starting today.  He has had episodes of muscular spasms 4 times this year with no cause found.  It  Is located in the left paralumbar area today.  Has been located over different areas of his back over the past year . he reports nausea and vomiting secondary to pain.  He is not sure what triggered these off.  He denies recent heavy lifting, trauma, change in his physical activity.  No urinary, fecal incontinence, saddle anesthesia, fevers, seizures, syncope, bilateral radicular pain.  He is eating and drinking well.  No diarrhea.  He does not take any medications on a regular basis.  He tried ibuprofen 400-600 mg, heat, ice, lidocaine patch, Flexeril, multivitamin, and an unknown muscle relaxant that started to work several hours after he took it.  No real alleviating factors.  No aggravating factors. Past medical history negative for spinal cord injury.  He is status post neck fusion.  No history of diabetes, hypertension, chronic kidney disease.  VQQ:[id number], Nyoka Cowden, MD   Past Medical History:  Diagnosis Date  . Asthma   . Back pain   . Frequent headaches     Past Surgical History:  Procedure Laterality Date  . NECK SURGERY  2016   C1-C2 fusion for os odontoideum    Family History  Problem Relation Age of Onset  . Heart disease Father   . Hypertension Father   . Anemia Mother     Social History   Tobacco Use  . Smoking status: Former Smoker    Packs/day: 1.50    Years: 1.50    Pack years: 2.25    Types: Cigarettes    Quit date: 2016    Years since quitting: 5.6  . Smokeless tobacco: Former Neurosurgeon    Types: Dorna Bloom    Quit date: 2016  Vaping Use  . Vaping Use: Every day  . Substances: Nicotine, THC, Flavoring  Substance Use Topics  . Alcohol use: Yes    Alcohol/week: 0.0 standard drinks  . Drug use: No    No current facility-administered medications for this  encounter.  Current Outpatient Medications:  .  ibuprofen (ADVIL) 600 MG tablet, Take 1 tablet (600 mg total) by mouth every 6 (six) hours as needed., Disp: 30 tablet, Rfl: 0 .  metaxalone (SKELAXIN) 800 MG tablet, Take 1 tablet (800 mg total) by mouth 3 (three) times daily. Take on an empty stomach, Disp: 30 tablet, Rfl: 0 .  methylPREDNISolone (MEDROL DOSEPAK) 4 MG TBPK tablet, Take by mouth daily. Follow package instructions, Disp: 21 tablet, Rfl: 0  No Known Allergies   ROS  As noted in HPI.   Physical Exam  BP 121/74   Pulse 83   Temp 97.9 F (36.6 C) (Oral)   Resp 18   Ht 6' (1.829 m)   Wt 84.4 kg   SpO2 98%   BMI 25.23 kg/m   Constitutional: Well developed, well nourished, appears uncomfortable Eyes:  EOMI, conjunctiva normal bilaterally HENT: Normocephalic, atraumatic,mucus membranes moist Respiratory: Normal inspiratory effort Cardiovascular: Normal rate GI: nondistended. No suprapubic tenderness skin: No rash, skin intact Musculoskeletal: no CVAT. + Left paralumbar tenderness, + left paralumbar muscle spasm. No bony tenderness. Bilateral lower extremities nontender, baseline ROM with intact PT pulses. No pain with int/ext rotation flex/extension hips bilaterally. SLR positive left side.. Sensation baseline light touch bilaterally for Pt, DTR's symmetric and intact bilaterally  KJ , Motor symmetric bilateral 5/5 hip flexion, quadriceps, hamstrings, EHL, foot dorsiflexion, foot plantarflexion, gait normal Neurologic: Alert & oriented x 3, no focal neuro deficits Psychiatric: Speech and behavior appropriate   ED Course   Medications  acetaminophen (TYLENOL) tablet 1,000 mg (1,000 mg Oral Given 12/08/19 1648)  ketorolac (TORADOL) injection 30 mg (30 mg Intramuscular Given 12/08/19 1649)    Orders Placed This Encounter  Procedures  . Basic metabolic panel    Standing Status:   Standing    Number of Occurrences:   1  . Magnesium    Standing Status:   Standing     Number of Occurrences:   1   Results for orders placed or performed during the hospital encounter of 12/08/19  Basic metabolic panel  Result Value Ref Range   Sodium 141 135 - 145 mmol/L   Potassium 3.7 3.5 - 5.1 mmol/L   Chloride 104 98 - 111 mmol/L   CO2 28 22 - 32 mmol/L   Glucose, Bld 94 70 - 99 mg/dL   BUN 12 6 - 20 mg/dL   Creatinine, Ser 8.58 0.61 - 1.24 mg/dL   Calcium 9.4 8.9 - 85.0 mg/dL   GFR calc non Af Amer >60 >60 mL/min   GFR calc Af Amer >60 >60 mL/min   Anion gap 9 5 - 15  Magnesium  Result Value Ref Range   Magnesium 2.1 1.7 - 2.4 mg/dL     No results found for this or any previous visit (from the past 24 hour(s)). No results found.  ED Clinical Impression  1. Lumbar paraspinal muscle spasm      ED Assessment/Plan   No evidence of spinal cord involvement based on H&P. Pt describing typical back pain, has been < 6 week duration. No historical red flags as noted in HPI. No physical red flags such as fever, bony tenderness, lower extremity weakness, saddle anesthesia. Imaging not indicated at this time.   Checking electrolytes including magnesium.  Will contact patient [phone] only if abnormal.  Will give Toradol 30, Tylenol 1000 mg p.o. here.  Home with ibuprofen 600 mg combined with 1000 mg of Tylenol 3-4 times a day as needed.  Skelaxin as he states that that worked well for him.  Medrol Dosepak due to his sciatica.  He is to add magnesium supplement to his multivitamins.  Advised deep tissue massage, gentle stretching.  Follow-up with PMD if not getting any better, to the ER if he gets worse. work Note for today.  Reviewed labs independently.   BMP, magnesium normal.   Discussed labs,  MDM, treatment plan, and plan for follow-up with patient. Discussed sn/sx that should prompt return to the ED. patient agrees with plan.   Meds ordered this encounter  Medications  . acetaminophen (TYLENOL) tablet 1,000 mg  . ketorolac (TORADOL) injection 30 mg  .  metaxalone (SKELAXIN) 800 MG tablet    Sig: Take 1 tablet (800 mg total) by mouth 3 (three) times daily. Take on an empty stomach    Dispense:  30 tablet    Refill:  0  . ibuprofen (ADVIL) 600 MG tablet    Sig: Take 1 tablet (600 mg total) by mouth every 6 (six) hours as needed.    Dispense:  30 tablet    Refill:  0  . methylPREDNISolone (MEDROL DOSEPAK) 4 MG TBPK tablet    Sig: Take by mouth daily. Follow package instructions    Dispense:  21 tablet    Refill:  0    *This clinic note was created using Scientist, clinical (histocompatibility and immunogenetics). Therefore, there may be occasional mistakes despite careful proofreading.  ?     Domenick Gong, MD 12/11/19 [phone]

## 2020-05-05 ENCOUNTER — Other Ambulatory Visit: Payer: Self-pay | Admitting: Internal Medicine

## 2020-05-05 DIAGNOSIS — J4540 Moderate persistent asthma, uncomplicated: Secondary | ICD-10-CM

## 2020-08-22 ENCOUNTER — Other Ambulatory Visit (INDEPENDENT_AMBULATORY_CARE_PROVIDER_SITE_OTHER): Payer: Self-pay | Admitting: Family Medicine

## 2020-08-22 DIAGNOSIS — J4540 Moderate persistent asthma, uncomplicated: Secondary | ICD-10-CM

## 2020-08-22 MED ORDER — ALBUTEROL SULFATE HFA 108 (90 BASE) MCG/ACT IN AERS
INHALATION_SPRAY | RESPIRATORY_TRACT | 6 refills | Status: DC
  Filled 2020-08-22: qty 18, 25d supply, fill #0
  Filled 2020-10-21: qty 18, 25d supply, fill #1
  Filled 2021-01-23: qty 18, 25d supply, fill #2
  Filled 2021-02-22: qty 18, 25d supply, fill #3
  Filled 2021-03-22: qty 18, 25d supply, fill #4
  Filled 2021-04-25: qty 18, 25d supply, fill #5
  Filled 2021-05-23: qty 18, 25d supply, fill #6

## 2020-08-23 ENCOUNTER — Other Ambulatory Visit: Payer: Self-pay

## 2020-10-21 ENCOUNTER — Other Ambulatory Visit: Payer: Self-pay

## 2020-10-26 ENCOUNTER — Encounter: Payer: Self-pay | Admitting: Emergency Medicine

## 2020-10-26 ENCOUNTER — Other Ambulatory Visit: Payer: Self-pay

## 2020-10-26 ENCOUNTER — Emergency Department
Admission: EM | Admit: 2020-10-26 | Discharge: 2020-10-26 | Disposition: A | Discharge status: Home / Self Care | Payer: No Typology Code available for payment source | Attending: Emergency Medicine | Admitting: Emergency Medicine

## 2020-10-26 DIAGNOSIS — X500XXA Overexertion from strenuous movement or load, initial encounter: Secondary | ICD-10-CM | POA: Insufficient documentation

## 2020-10-26 DIAGNOSIS — Z7951 Long term (current) use of inhaled steroids: Secondary | ICD-10-CM | POA: Diagnosis not present

## 2020-10-26 DIAGNOSIS — M549 Dorsalgia, unspecified: Secondary | ICD-10-CM | POA: Diagnosis present

## 2020-10-26 DIAGNOSIS — M542 Cervicalgia: Secondary | ICD-10-CM | POA: Insufficient documentation

## 2020-10-26 DIAGNOSIS — J45909 Unspecified asthma, uncomplicated: Secondary | ICD-10-CM | POA: Insufficient documentation

## 2020-10-26 DIAGNOSIS — Z87891 Personal history of nicotine dependence: Secondary | ICD-10-CM | POA: Diagnosis not present

## 2020-10-26 MED ORDER — KETOROLAC TROMETHAMINE 30 MG/ML IJ SOLN
60.0000 mg | Freq: Once | INTRAMUSCULAR | Status: AC
  Administered 2020-10-26: 60 mg via INTRAMUSCULAR
  Filled 2020-10-26: qty 2

## 2020-10-26 MED ORDER — PREDNISONE 10 MG (21) PO TBPK: ORAL_TABLET | ORAL | 0 refills | Status: DC

## 2020-10-26 MED ORDER — METHOCARBAMOL 500 MG PO TABS: 500.0000 mg | ORAL_TABLET | Freq: Three times a day (TID) | ORAL | 0 refills | Status: DC | PRN

## 2020-10-26 MED ORDER — ONDANSETRON 4 MG PO TBDP: 4.0000 mg | ORAL_TABLET | Freq: Four times a day (QID) | ORAL | 0 refills | Status: DC | PRN

## 2020-10-26 MED ORDER — PREDNISONE 20 MG PO TABS
60.0000 mg | ORAL_TABLET | Freq: Once | ORAL | Status: AC
  Administered 2020-10-26: 60 mg via ORAL
  Filled 2020-10-26: qty 3

## 2020-10-26 MED ORDER — HYDROCODONE-ACETAMINOPHEN 5-325 MG PO TABS: 2.0000 | ORAL_TABLET | Freq: Four times a day (QID) | ORAL | 0 refills | Status: DC | PRN

## 2020-10-26 MED ORDER — ONDANSETRON 4 MG PO TBDP
4.0000 mg | ORAL_TABLET | Freq: Once | ORAL | Status: AC
  Administered 2020-10-26: 4 mg via ORAL
  Filled 2020-10-26: qty 1

## 2020-10-26 MED ORDER — IBUPROFEN 800 MG PO TABS: 800.0000 mg | ORAL_TABLET | Freq: Three times a day (TID) | ORAL | 0 refills | Status: DC | PRN

## 2020-10-26 NOTE — Discharge Instructions (Addendum)

## 2020-10-26 NOTE — ED Provider Notes (Signed)
[facility] Emergency Department Provider Note  ____________________________________________   Event Date/Time   First MD Initiated Contact with Patient 10/26/20 0502     (approximate)  I have reviewed the triage vital signs and the nursing notes.   HISTORY  Chief Complaint Back Pain    HPI Christopher White is a 25 y.o. male with history of previous C1-C2 fusion in 2016 who presents to the emergency department complaints of back pain for the past several days.  Describes it as a muscle spasm and feels like the right side of his neck and thoracic paraspinal muscles are swollen.  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  Denies numbness, tingling or weakness.  No bowel or bladder incontinence.  No urinary retention.  No fever.  No history of recent epidural injections.  No history of HIV, cancer, diabetes, IV drug abuse.  Has taken Skelaxin at home which he had leftover from previous episodes of muscle spasms which he states has not been helping his pain.  States his pain has been so severe that it has caused him to vomit.  Pain worse with movement, palpation.        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 Authorizing Provider  HYDROcodone-acetaminophen (NORCO/VICODIN) 5-325 MG tablet Take 2 tablets by mouth every 6 (six) hours as needed. 10/26/20  Yes Kamiya Acord, Layla Maw, DO  ibuprofen (ADVIL) 800 MG tablet Take 1 tablet (800 mg total) by mouth every 8  (eight) hours as needed for mild pain. 10/26/20  Yes Favor Kreh, Layla Maw, DO  methocarbamol (ROBAXIN) 500 MG tablet Take 1 tablet (500 mg total) by mouth every 8 (eight) hours as needed for muscle spasms. 10/26/20  Yes Ameli Sangiovanni, Baxter Hire N, DO  ondansetron (ZOFRAN ODT) 4 MG disintegrating tablet Take 1 tablet (4 mg total) by mouth every 6 (six) hours as needed for nausea or vomiting. 10/26/20  Yes Saragrace Selke, Layla Maw, DO  predniSONE (STERAPRED UNI-PAK 21 TAB) 10 MG (21) TBPK tablet Take as directed 10/26/20  Yes Rui Wordell N, DO  albuterol (VENTOLIN HFA) 108 (90 Base) MCG/ACT inhaler INHALE 2 PUFFS BY MOUTH EVERY 6 HOURS AS NEEDED FOR WHEEZING OR SHORTNESS OF BREATH. 08/22/20   Helane Rima, DO  budesonide-formoterol (SYMBICORT) 160-4.5 MCG/ACT inhaler Inhale 2 puffs into the lungs 2 (two) times daily. 04/14/19 04/29/19  Helane Rima, DO  Cetirizine HCl 10 MG CAPS Take by mouth.  04/29/19  [provider]  gabapentin (NEURONTIN) 100 MG capsule Take 1 capsule (100 mg total) by mouth 3 (three) times daily. 10/24/18 04/29/19  Helane Rima, DO  traZODone (DESYREL) 50 MG tablet Take 0.5-1 tablets (25-50 mg total) by mouth at bedtime as needed for sleep. 10/24/18 04/29/19  Helane Rima, DO    Allergies Patient has no known allergies.  Family History  Problem Relation Age of Onset   Heart disease Father    Hypertension Father    Anemia  Mother     Social History Social History   Tobacco Use   Smoking status: Former    Packs/day: 1.50    Years: 1.50    Pack years: 2.25    Types: Cigarettes    Quit date: 2016    Years since quitting: 6.5   Smokeless tobacco: Former    Types: Chew    Quit date: 2016  Vaping Use   Vaping Use: Every day   Substances: Nicotine, THC, Flavoring  Substance Use Topics   Alcohol use: Yes    Alcohol/week: 0.0 standard drinks   Drug use: No    Review of Systems Constitutional: No fever. Eyes: No visual changes. ENT: No sore throat. Cardiovascular: Denies chest  pain. Respiratory: Denies shortness of breath. Gastrointestinal: No nausea, vomiting, diarrhea. Genitourinary: Negative for dysuria. Musculoskeletal: Negative for back pain. Skin: Negative for rash. Neurological: Negative for focal weakness or numbness.  ____________________________________________   PHYSICAL EXAM:  VITAL SIGNS: ED Triage Vitals  Enc Vitals Group     BP 10/26/20 0451 (!) 142/100     Pulse Rate 10/26/20 0451 65     Resp 10/26/20 0451 18     Temp 10/26/20 0451 97.7 F (36.5 C)     Temp Source 10/26/20 0451 Oral     SpO2 10/26/20 0451 98 %     Weight 10/26/20 0452 200 lb (90.7 kg)     Height 10/26/20 0452 6' (1.829 m)     Head Circumference --      Peak Flow --      Pain Score 10/26/20 0451 6     Pain Loc --      Pain Edu? --      Excl. in GC? --    CONSTITUTIONAL: Alert and oriented and responds appropriately to questions. Well-appearing; well-nourished HEAD: Normocephalic EYES: Conjunctivae clear, pupils appear equal, EOM appear intact ENT: normal nose; moist mucous membranes NECK: Supple, normal ROM, pain with turning his head to the right and left, tenderness palpation over the right cervical paraspinal muscles without redness, warmth, ecchymosis or significant soft tissue swelling; no cervical lymphadenopathy appreciated CARD: RRR; S1 and S2 appreciated; no murmurs, no clicks, no rubs, no gallops RESP: Normal chest excursion without splinting or tachypnea; breath sounds clear and equal bilaterally; no wheezes, no rhonchi, no rales, no hypoxia or respiratory distress, speaking full sentences ABD/GI: Normal bowel sounds; non-distended; soft, non-tender, no rebound, no guarding, no peritoneal signs, no hepatosplenomegaly BACK: The back appears normal, no midline spinal tenderness or step-off or deformity, tender to palpation over the right thoracic paraspinal muscles without redness, warmth, ecchymosis, soft tissue swelling EXT: Normal ROM in all joints; no  deformity noted, no edema; no cyanosis SKIN: Normal color for age and race; warm; no rash on exposed skin NEURO: Moves all extremities equally, normal gait, normal sensation diffusely, no saddle anesthesia, no clonus, normal speech PSYCH: The patient's mood and manner are appropriate.  ____________________________________________   LABS (all labs ordered are listed, but only abnormal results are displayed)  Labs Reviewed - No data to display ____________________________________________  EKG   ____________________________________________  RADIOLOGY I, Liboria Putnam, personally viewed and evaluated these images (plain radiographs) as part of my medical decision making, as well as reviewing the written report by the radiologist.  ED MD interpretation:    Official radiology report(s): No results found.  ____________________________________________   PROCEDURES  Procedure(s) performed (including Critical Care):  Procedures    ____________________________________________   INITIAL IMPRESSION / ASSESSMENT AND PLAN /  ED COURSE  As part of my medical decision making, I reviewed the following data within the electronic MEDICAL RECORD NUMBER Nursing notes reviewed and incorporated, Old chart reviewed, Notes from prior ED visits, and [place] Controlled Substance Database         Patient here with back pain.  Seems to be musculoskeletal in nature.  No red flag symptoms to suggest cauda equina, spinal stenosis, cervical myelopathy, epidural abscess or hematoma, discitis or osteomyelitis, fracture, transverse myelitis.  I do not feel he needs emergent imaging.  He does appear uncomfortable today but drove himself to the emergency department.  Will give Toradol and prednisone here.  Will discharge with prescriptions for prednisone taper, ibuprofen, Vicodin, Robaxin.  He has an outpatient PCP for follow-up.  Discussed return precautions.  Provided with work note.  Patient comfortable with this  plan.  I do not feel he needs emergent imaging today.  At this time, I do not feel there is any life-threatening condition present. I have reviewed, interpreted and discussed all results (EKG, imaging, lab, urine as appropriate) and exam findings with patient/family. I have reviewed nursing notes and appropriate previous records.  I feel the patient is safe to be discharged home without further emergent workup and can continue workup as an outpatient as needed. Discussed usual and customary return precautions. Patient/family verbalize understanding and are comfortable with this plan.  Outpatient follow-up has been provided as needed. All questions have been answered.  ____________________________________________   FINAL CLINICAL IMPRESSION(S) / ED DIAGNOSES  Final diagnoses:  Cervical spine pain  Upper back pain on right side     ED Discharge Orders          Ordered    ibuprofen (ADVIL) 800 MG tablet  Every 8 hours PRN        10/26/20 0523    predniSONE (STERAPRED UNI-PAK 21 TAB) 10 MG (21) TBPK tablet        10/26/20 0523    methocarbamol (ROBAXIN) 500 MG tablet  Every 8 hours PRN        10/26/20 0523    HYDROcodone-acetaminophen (NORCO/VICODIN) 5-325 MG tablet  Every 6 hours PRN        10/26/20 0523    ondansetron (ZOFRAN ODT) 4 MG disintegrating tablet  Every 6 hours PRN        10/26/20 0523            *Please note:  Florian Buff was evaluated in Emergency Department on 10/26/2020 for the symptoms described in the history of present illness. He was evaluated in the context of the global COVID-19 pandemic, which necessitated consideration that the patient might be at risk for infection with the SARS-CoV-2 virus that causes COVID-19. Institutional protocols and algorithms that pertain to the evaluation of patients at risk for COVID-19 are in a state of rapid change based on information released by regulatory bodies including the CDC and federal and state organizations.  These policies and algorithms were followed during the patient's care in the ED.  Some ED evaluations and interventions may be delayed as a result of limited staffing during and the pandemic.*   Note:  This document was prepared using Dragon voice recognition software and may include unintentional dictation errors.    Kelie Gainey, Layla Maw, DO 10/26/20 [phone]

## 2020-10-26 NOTE — ED Triage Notes (Signed)
Patient ambulatory to triage with steady gait, without difficulty or distress noted; pt reports lower back muscle spasms for several days; st hx of same

## 2020-10-27 ENCOUNTER — Other Ambulatory Visit: Payer: Self-pay

## 2020-10-27 ENCOUNTER — Ambulatory Visit (INDEPENDENT_AMBULATORY_CARE_PROVIDER_SITE_OTHER): Payer: No Typology Code available for payment source | Admitting: Internal Medicine

## 2020-10-27 ENCOUNTER — Ambulatory Visit
Admission: RE | Admit: 2020-10-27 | Discharge: 2020-10-27 | Disposition: A | Discharge status: Home / Self Care | Payer: No Typology Code available for payment source | Source: Ambulatory Visit | Attending: Internal Medicine | Admitting: Internal Medicine

## 2020-10-27 ENCOUNTER — Ambulatory Visit
Admission: RE | Admit: 2020-10-27 | Discharge: 2020-10-27 | Disposition: A | Discharge status: Home / Self Care | Payer: No Typology Code available for payment source | Attending: Internal Medicine | Admitting: Internal Medicine

## 2020-10-27 ENCOUNTER — Encounter: Payer: Self-pay | Admitting: Internal Medicine

## 2020-10-27 VITALS — BP 138/78 | HR 76 | Resp 14 | Ht 72.0 in | Wt 204.0 lb

## 2020-10-27 DIAGNOSIS — M438X1 Other specified deforming dorsopathies, occipito-atlanto-axial region: Secondary | ICD-10-CM | POA: Insufficient documentation

## 2020-10-27 DIAGNOSIS — M545 Low back pain, unspecified: Secondary | ICD-10-CM

## 2020-10-27 DIAGNOSIS — M62830 Muscle spasm of back: Secondary | ICD-10-CM

## 2020-10-27 DIAGNOSIS — R221 Localized swelling, mass and lump, neck: Secondary | ICD-10-CM | POA: Insufficient documentation

## 2020-10-27 MED ORDER — METHOCARBAMOL 500 MG PO TABS
500.0000 mg | ORAL_TABLET | Freq: Three times a day (TID) | ORAL | 0 refills | Status: DC | PRN
  Filled 2020-10-27 (×2): qty 60, 20d supply, fill #0

## 2020-10-27 MED ORDER — HYDROCODONE-ACETAMINOPHEN 5-325 MG PO TABS
2.0000 | ORAL_TABLET | Freq: Four times a day (QID) | ORAL | 0 refills | Status: DC | PRN
  Filled 2020-10-31: qty 20, 3d supply, fill #0

## 2020-10-27 NOTE — Progress Notes (Signed)
Date:  10/27/2020   Name:  Christopher White [facility]   DOB:  07-Jan-1996   MRN:  [id number]   Chief Complaint: Back Pain (Back pain happening more frequently on and off. Neck is swelling. Had neck surgery in 2017. Seen Er yesterday and was given prednisone,  ibuprofen, hydrocodone, and muscle relaxer. Alternating heat and ice 6 times daily. )  Back Pain This is a recurrent problem. The current episode started in the past 7 days. The problem is unchanged. The pain is present in the lumbar spine. The quality of the pain is described as aching, burning and cramping. The pain does not radiate. The pain is moderate. Pertinent negatives include no bladder incontinence, bowel incontinence, chest pain, fever, headaches, numbness, paresthesias or perianal numbness. He has tried analgesics, heat, ice, muscle relaxant and NSAIDs (given vicodin, robaxin, advil and steroid taper yesterday by ER) for the symptoms. The treatment provided mild relief.  Neck Pain  This is a recurrent problem. The pain is associated with a remote injury (with hx of fusion). The pain is present in the right side. The quality of the pain is described as cramping and aching. The pain is mild. Pertinent negatives include no chest pain, fever, headaches or numbness. He has tried NSAIDs, oral narcotics and muscle relaxants for the symptoms. The treatment provided no relief.   Lab Results  Component Value Date   CREATININE 0.91 12/08/2019   BUN 12 12/08/2019   NA 141 12/08/2019   K 3.7 12/08/2019   CL 104 12/08/2019   CO2 28 12/08/2019   Lab Results  Component Value Date   CHOL 136 10/24/2018   HDL 34 (L) 10/24/2018   LDLCALC 78 10/24/2018   TRIG 137 10/24/2018   CHOLHDL 4.0 10/24/2018   No results found for: TSH No results found for: HGBA1C Lab Results  Component Value Date   WBC 6.0 10/24/2018   HGB 15.8 10/24/2018   HCT 47.0 10/24/2018   MCV 88.5 10/24/2018   PLT 221 10/24/2018   Lab Results  Component Value Date    ALT 16 10/24/2018   AST 22 10/24/2018   BILITOT 0.5 10/24/2018     Review of Systems  Constitutional:  Negative for chills, fatigue and fever.  Respiratory:  Negative for chest tightness and shortness of breath.   Cardiovascular:  Negative for chest pain and palpitations.  Gastrointestinal:  Negative for bowel incontinence.  Genitourinary:  Negative for bladder incontinence.  Musculoskeletal:  Positive for arthralgias, back pain and neck pain. Negative for gait problem.  Neurological:  Negative for dizziness, numbness, headaches and paresthesias.   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   Screening for STD (sexually transmitted disease) 06/24/2015    No Known Allergies  Past Surgical History:  Procedure Laterality Date   NECK SURGERY  2016   C1-C2 fusion for os odontoideum    Social History   Tobacco Use   Smoking status: Former    Packs/day: 1.50    Years: 1.50    Pack years: 2.25    Types: Cigarettes    Quit date: 2016    Years since quitting: 6.5   Smokeless tobacco: Former    Types: Chew    Quit date: 2016  Vaping Use   Vaping Use: Every day   Substances: Nicotine, THC, Flavoring  Substance Use Topics   Alcohol use:  Yes    Alcohol/week: 0.0 standard drinks   Drug use: No     Medication list has been reviewed and updated.  Current Meds  Medication Sig   albuterol (VENTOLIN HFA) 108 (90 Base) MCG/ACT inhaler INHALE 2 PUFFS BY MOUTH EVERY 6 HOURS AS NEEDED FOR WHEEZING OR SHORTNESS OF BREATH.   HYDROcodone-acetaminophen (NORCO/VICODIN) 5-325 MG tablet Take 2 tablets by mouth every 6 (six) hours as needed.   ibuprofen (ADVIL) 800 MG tablet Take 1 tablet (800 mg total) by mouth every 8 (eight) hours as needed for mild pain.   methocarbamol (ROBAXIN) 500 MG tablet Take 1 tablet (500 mg total) by mouth every 8 (eight) hours  as needed for muscle spasms.   ondansetron (ZOFRAN ODT) 4 MG disintegrating tablet Take 1 tablet (4 mg total) by mouth every 6 (six) hours as needed for nausea or vomiting.   predniSONE (STERAPRED UNI-PAK 21 TAB) 10 MG (21) TBPK tablet Take as directed    PHQ 2/9 Scores 10/27/2020 06/01/2019 10/24/2018  PHQ - 2 Score 1 0 0  PHQ- 9 Score 3 - 3    GAD 7 : Generalized Anxiety Score 10/27/2020  Nervous, Anxious, on Edge 0  Control/stop worrying 0  Worry too much - different things 0  Trouble relaxing 1  Restless 0  Easily annoyed or irritable 0  Afraid - awful might happen 0  Total GAD 7 Score 1  Anxiety Difficulty Not difficult at all    BP Readings from Last 3 Encounters:  10/27/20 138/78  10/26/20 (!) 142/100  12/08/19 121/74    Physical Exam Vitals and nursing note reviewed.  Constitutional:      General: He is not in acute distress.    Appearance: Normal appearance. He is well-developed.  HENT:     Head: Normocephalic and atraumatic.  Cardiovascular:     Rate and Rhythm: Normal rate and regular rhythm.     Pulses: Normal pulses.     Heart sounds: No murmur heard. Pulmonary:     Effort: Pulmonary effort is normal. No respiratory distress.     Breath sounds: No wheezing or rhonchi.  Skin:    General: Skin is warm and dry.     Findings: No rash.  Neurological:     Mental Status: He is alert and oriented to person, place, and time.  Psychiatric:        Mood and Affect: Mood normal.        Behavior: Behavior normal.    Wt Readings from Last 3 Encounters:  10/27/20 204 lb (92.5 kg)  10/26/20 200 lb (90.7 kg)  12/08/19 186 lb (84.4 kg)    BP 138/78 (BP Location: Left Arm, Patient Position: Sitting, Cuff Size: Large)   Pulse 76   Resp 14   Ht 6' (1.829 m)   Wt 204 lb (92.5 kg)   SpO2 98%   BMI 27.67 kg/m   Assessment and Plan: 1. Muscle spasm of back Continue current regimen May need to see Ortho if persistent or xrays abnormal - HYDROcodone-acetaminophen  (NORCO/VICODIN) 5-325 MG tablet; Take 2 tablets by mouth every 6 (six) hours as needed.  Dispense: 20 tablet; Refill: 0 - methocarbamol (ROBAXIN) 500 MG tablet; Take 1 tablet (500 mg total) by mouth every 8 (eight) hours as needed for muscle spasms.  Dispense: 60 tablet; Refill: 0 - DG Lumbar Spine Complete; Future  2. Neck swelling Concern for hardware issues Will get films; may need to see Neurosurgery - DG Cervical Spine Complete;  Future  3. Os odontoideum S/p fusion with hardware 2016 - DG Cervical Spine Complete; Future  4. Right lumbar pain - DG Lumbar Spine Complete; Future   Partially dictated using Animal nutritionist. Any errors are unintentional.  Bari Edward, MD [facility]  10/27/2020

## 2020-10-28 ENCOUNTER — Other Ambulatory Visit: Payer: Self-pay

## 2020-10-28 DIAGNOSIS — Z981 Arthrodesis status: Secondary | ICD-10-CM

## 2020-10-31 ENCOUNTER — Other Ambulatory Visit: Payer: Self-pay

## 2021-01-23 ENCOUNTER — Other Ambulatory Visit: Payer: Self-pay

## 2021-02-22 ENCOUNTER — Other Ambulatory Visit: Payer: Self-pay

## 2021-03-22 ENCOUNTER — Other Ambulatory Visit: Payer: Self-pay

## 2021-04-25 ENCOUNTER — Other Ambulatory Visit: Payer: Self-pay

## 2021-05-23 ENCOUNTER — Other Ambulatory Visit: Payer: Self-pay

## 2021-07-03 ENCOUNTER — Other Ambulatory Visit (INDEPENDENT_AMBULATORY_CARE_PROVIDER_SITE_OTHER): Payer: Self-pay | Admitting: Internal Medicine

## 2021-07-03 ENCOUNTER — Other Ambulatory Visit: Payer: Self-pay

## 2021-07-03 DIAGNOSIS — J4540 Moderate persistent asthma, uncomplicated: Secondary | ICD-10-CM

## 2021-07-05 ENCOUNTER — Other Ambulatory Visit: Payer: Self-pay

## 2021-07-05 NOTE — Telephone Encounter (Signed)
Requested medication (s) are due for refill today: yes ? ?Requested medication (s) are on the active medication list: yes ? ?Last refill:  08/22/20 18g with 6 RF ? ?Future visit scheduled: no ? ?Notes to clinic:  Rx written by prescriber not in this practice. Has had visit in this office addressing asthma, did not want to deny an inhaler, please assess. ? ? ?  ? ?Requested Prescriptions  ?Pending Prescriptions Disp Refills  ? albuterol (VENTOLIN HFA) 108 (90 Base) MCG/ACT inhaler 18 g 6  ?  Sig: INHALE 2 PUFFS BY MOUTH EVERY 6 HOURS AS NEEDED FOR WHEEZING OR SHORTNESS OF BREATH.  ?  ? There is no refill protocol information for this order  ?  ? ? ?

## 2021-07-06 ENCOUNTER — Telehealth: Payer: Self-pay | Admitting: Internal Medicine

## 2021-07-06 ENCOUNTER — Other Ambulatory Visit: Payer: Self-pay

## 2021-07-06 DIAGNOSIS — J4540 Moderate persistent asthma, uncomplicated: Secondary | ICD-10-CM

## 2021-07-06 MED ORDER — ALBUTEROL SULFATE HFA 108 (90 BASE) MCG/ACT IN AERS
INHALATION_SPRAY | RESPIRATORY_TRACT | 0 refills | Status: DC
  Filled 2021-07-06: qty 18, 25d supply, fill #0

## 2021-07-06 NOTE — Telephone Encounter (Signed)
Rx sent to pharmacy. OV scheduled. ?

## 2021-07-06 NOTE — Telephone Encounter (Signed)
Refill sent in and follow up appointment scheduled. ? ?KP ?

## 2021-07-06 NOTE — Telephone Encounter (Signed)
Copied from CRM [id number]. Topic: General - Inquiry ?>> Jul 06, 2021 10:00 AM Aretta Nip wrote: ? albuterol (VENTOLIN HFA) 108 (90 Base) MCG/ACT inhaler 18 g 6 08/22/2020   ?Sig: INHALE 2 PUFFS BY MOUTH EVERY 6 HOURS AS NEEDED FOR WHEEZING OR SHORTNESS OF BREATH.  ?Sent to pharmacy as: albuterol (VENTOLIN HFA) 108 (90 Base) MCG/ACT inhaler  ?Pt mother, Asher Muir is very upset this has been denied, she states she works for [employer] and that [employer] will refill as long as you have been seen within the year. She wanted to talk with Dr B but office staff including Dr B are not available. A CRM sent to office was requested and Asher Muir, mother, is expecting a fu call asap [phone] ?

## 2021-07-06 NOTE — Telephone Encounter (Signed)
Patient mom called [facility] today and wants to know why Dr. Judithann Graves denied her son's inhaler. Patient stated she worked for [employer] for years and knows the Protocol, and says he is allowed refills up to a year and his Inhaler was giving in July of 2022 so his year isn't up yet, per mother of the patient. Mom would like for someone to call her back as soon as possible.  ?

## 2021-07-06 NOTE — Telephone Encounter (Signed)
Called pt VM is not set up. Could not leave VM. Pt needs and office visit before a refill can be sent in. Last seen in 2021. ? ?[facility] nurse may give results to patient if they return call to clinic, a CRM has been created. ? ?KP ?

## 2021-07-14 ENCOUNTER — Ambulatory Visit (INDEPENDENT_AMBULATORY_CARE_PROVIDER_SITE_OTHER): Payer: No Typology Code available for payment source | Admitting: Internal Medicine

## 2021-07-14 ENCOUNTER — Encounter: Payer: Self-pay | Admitting: Internal Medicine

## 2021-07-14 ENCOUNTER — Other Ambulatory Visit: Payer: Self-pay

## 2021-07-14 VITALS — BP 122/78 | HR 70 | Ht 72.0 in | Wt 210.0 lb

## 2021-07-14 DIAGNOSIS — E7801 Familial hypercholesterolemia: Secondary | ICD-10-CM

## 2021-07-14 DIAGNOSIS — J4540 Moderate persistent asthma, uncomplicated: Secondary | ICD-10-CM

## 2021-07-14 MED ORDER — MONTELUKAST SODIUM 10 MG PO TABS
10.0000 mg | ORAL_TABLET | Freq: Every day | ORAL | 1 refills | Status: DC
  Filled 2021-07-14: qty 90, 90d supply, fill #0
  Filled 2022-02-28: qty 30, 30d supply, fill #1
  Filled 2022-04-20: qty 30, 30d supply, fill #2

## 2021-07-14 MED ORDER — ALBUTEROL SULFATE HFA 108 (90 BASE) MCG/ACT IN AERS
INHALATION_SPRAY | RESPIRATORY_TRACT | 3 refills | Status: AC
  Filled 2021-07-14: qty 54, 75d supply, fill #0
  Filled 2021-08-04: qty 20.1, 75d supply, fill #0
  Filled 2021-10-05: qty 20.1, 75d supply, fill #1
  Filled 2021-11-27: qty 6.7, 25d supply, fill #1
  Filled 2022-01-25: qty 6.7, 25d supply, fill #2
  Filled 2022-02-13: qty 6.7, 25d supply, fill #3
  Filled 2022-03-12: qty 6.7, 25d supply, fill #4
  Filled 2022-04-20: qty 6.7, 25d supply, fill #5
  Filled 2022-06-22: qty 6.7, 25d supply, fill #6

## 2021-07-14 NOTE — Progress Notes (Signed)
? ? ?Date:  07/14/2021  ? ?Name:  Christopher BraveGarrett Dakota [facility]   DOB:  [birth year]   MRN:  [id number] ? ? ?Chief Complaint: Asthma ? ?Asthma ?He complains of chest tightness and wheezing. There is no difficulty breathing, hemoptysis, hoarse voice or shortness of breath. This is a chronic problem. The problem occurs daily. The problem has been unchanged. Pertinent negatives include no chest pain, fever or headaches. His symptoms are aggravated by lying down, change in weather and animal exposure. His symptoms are alleviated by beta-agonist. He reports significant (but only temporary) improvement on treatment. Ineffective treatments: he did not like symbicort because he gained weight thought to be the steroid. His past medical history is significant for asthma.  ? ? ?  07/14/2021  ?  8:41 AM  ?PUL ASTHMA HISTORY  ?Symptoms Throughout the day  ?Nighttime awakenings Often--7/wk  ?Interference with activity No limitations  ?SABA use Several times/day  ?Exacerbations requiring oral steroids 0-1 / year  ?Asthma Severity Moderate Persistent  ? ?  ?Lab Results  ?Component Value Date  ? NA 141 12/08/2019  ? K 3.7 12/08/2019  ? CO2 28 12/08/2019  ? GLUCOSE 94 12/08/2019  ? BUN 12 12/08/2019  ? CREATININE 0.91 12/08/2019  ? CALCIUM 9.4 12/08/2019  ? GFRNONAA >60 12/08/2019  ? ?Lab Results  ?Component Value Date  ? CHOL 136 10/24/2018  ? HDL 34 (L) 10/24/2018  ? LDLCALC 78 10/24/2018  ? TRIG 137 10/24/2018  ? CHOLHDL 4.0 10/24/2018  ? ?No results found for: TSH ?No results found for: HGBA1C ?Lab Results  ?Component Value Date  ? WBC 6.0 10/24/2018  ? HGB 15.8 10/24/2018  ? HCT 47.0 10/24/2018  ? MCV 88.5 10/24/2018  ? PLT 221 10/24/2018  ? ?Lab Results  ?Component Value Date  ? ALT 16 10/24/2018  ? AST 22 10/24/2018  ? BILITOT 0.5 10/24/2018  ? ?No results found for: 25OHVITD2, 25OHVITD3, VD25OH  ? ?Review of Systems  ?Constitutional:  Negative for chills, fatigue, fever and unexpected weight change.  ?HENT:  Negative for hoarse voice.    ?Respiratory:  Positive for wheezing. Negative for hemoptysis, chest tightness and shortness of breath.   ?Cardiovascular:  Negative for chest pain and palpitations.  ?Allergic/Immunologic: Positive for environmental allergies.  ?Neurological:  Negative for dizziness, light-headedness and headaches.  ?Psychiatric/Behavioral:  Negative for dysphoric mood and sleep disturbance. The patient is not nervous/anxious.   ? ?Patient Active Problem List  ? Diagnosis Date Noted  ? Muscle spasm of back 06/01/2019  ? Moderate persistent asthma without complication 06/01/2019  ? Pars defect of lumbar spine 04/25/2019  ? 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 Screening for STD (sexually transmitted disease) 06/24/2015  ? ? ?No Known Allergies ? ?Past Surgical History:  ?Procedure Laterality Date  ? NECK SURGERY  2016  ? C1-C2 fusion for os odontoideum  ? ? ?Social History  ? ?Tobacco Use  ? Smoking status: Former  ?  Packs/day: 1.50  ?  Years: 1.50  ?  Pack years: 2.25  ?  Types: Cigarettes  ?  Quit date: 2016  ?  Years since quitting: 7.2  ? Smokeless tobacco: Former  ?  Types: Chew  ?  Quit date: 2016  ?Vaping Use  ? Vaping Use: Every day  ? Substances: Nicotine, THC, Flavoring  ?Substance Use Topics  ? Alcohol use: Yes  ?  Alcohol/week: 0.0 standard drinks  ? Drug use: No  ? ? ? ?Medication list has been reviewed and updated. ? ?Current  Meds  ?Medication Sig  ? ibuprofen (ADVIL) 800 MG tablet Take 1 tablet (800 mg total) by mouth every 8 (eight) hours as needed for mild pain.  ? montelukast (SINGULAIR) 10 MG tablet Take 1 tablet (10 mg total) by mouth at bedtime.  ? [DISCONTINUED] albuterol (VENTOLIN HFA) 108 (90 Base) MCG/ACT inhaler INHALE 2 PUFFS BY MOUTH EVERY 6 HOURS AS NEEDED FOR WHEEZING OR SHORTNESS OF BREATH.  ? ? ? ?  07/14/2021  ?  8:23 AM 10/27/2020  ?  9:43 AM  ?GAD 7 : Generalized Anxiety Score  ?Nervous, Anxious, on Edge 0 0  ?Control/stop worrying 0 0  ?Worry too much - different things 0 0  ?Trouble relaxing 2 1  ?Restless  2 0  ?Easily annoyed or irritable 1 0  ?Afraid - awful might happen 0 0  ?Total GAD 7 Score 5 1  ?Anxiety Difficulty Not difficult at all Not difficult at all  ? ? ? ?  07/14/2021  ?  8:23 AM  ?Depression screen PHQ 2/9  ?Decreased Interest 3  ?Down, Depressed, Hopeless 0  ?PHQ - 2 Score 3  ?Altered sleeping 3  ?Tired, decreased energy 0  ?Change in appetite 0  ?Feeling bad or failure about yourself  0  ?Trouble concentrating 0  ?Moving slowly or fidgety/restless 0  ?Suicidal thoughts 0  ?PHQ-9 Score 6  ?Difficult doing work/chores Not difficult at all  ? ? ?BP Readings from Last 3 Encounters:  ?07/14/21 122/78  ?10/27/20 138/78  ?10/26/20 (!) 142/100  ? ? ?Physical Exam ?Vitals and nursing note reviewed.  ?Constitutional:   ?   General: He is not in acute distress. ?   Appearance: Normal appearance. He is well-developed.  ?HENT:  ?   Head: Normocephalic and atraumatic.  ?Cardiovascular:  ?   Rate and Rhythm: Normal rate and regular rhythm.  ?   Pulses: Normal pulses.  ?   Heart sounds: No murmur heard. ?Pulmonary:  ?   Effort: Pulmonary effort is normal. No respiratory distress.  ?Musculoskeletal:  ?   Cervical back: Normal range of motion.  ?   Right lower leg: No edema.  ?   Left lower leg: No edema.  ?Lymphadenopathy:  ?   Cervical: No cervical adenopathy.  ?Skin: ?   General: Skin is warm and dry.  ?   Capillary Refill: Capillary refill takes less than 2 seconds.  ?   Findings: No rash.  ?Neurological:  ?   Mental Status: He is alert and oriented to person, place, and time.  ?Psychiatric:     ?   Mood and Affect: Mood normal.     ?   Behavior: Behavior normal.  ? ? ?Wt Readings from Last 3 Encounters:  ?07/14/21 210 lb (95.3 kg)  ?10/27/20 204 lb (92.5 kg)  ?10/26/20 200 lb (90.7 kg)  ? ? ?BP 122/78   Pulse 70   Ht 6' (1.829 m)   Wt 210 lb (95.3 kg)   SpO2 99%   BMI 28.48 kg/m?  ? ?Assessment and Plan: ?1. Moderate persistent asthma without complication ?Using albuterol MDI several times a day.  Discussed  need to start a daily preventative.   ?Options given - will start Singulair.  He will let me know in a few weeks how he is doing. ?Consider adding Serevent diskus. ?- Comprehensive metabolic panel ?- CBC with Differential/Platelet ?- montelukast (SINGULAIR) 10 MG tablet; Take 1 tablet (10 mg total) by mouth at bedtime.  Dispense: 90 tablet; Refill:  1 ?- albuterol (VENTOLIN HFA) 108 (90 Base) MCG/ACT inhaler; INHALE 2 PUFFS BY MOUTH EVERY 6 HOURS AS NEEDED FOR WHEEZING OR SHORTNESS OF BREATH.  Dispense: 54 g; Refill: 3 ? ?2. Familial hypercholesterolemia ?Check fasting labs today. ?- Lipid panel ? ? ?Partially dictated using Animal nutritionist. Any errors are unintentional. ? ?Bari Edward, MD ?[facility] ? ?07/14/2021 ? ? ? ? ?

## 2021-07-15 LAB — CBC WITH DIFFERENTIAL/PLATELET
Basophils Absolute: 0 10*3/uL (ref 0.0–0.2)
Basos: 0 %
EOS (ABSOLUTE): 0.1 10*3/uL (ref 0.0–0.4)
Eos: 3 %
Hematocrit: 47.9 % (ref 37.5–51.0)
Hemoglobin: 17 g/dL (ref 13.0–17.7)
Immature Grans (Abs): 0 10*3/uL (ref 0.0–0.1)
Immature Granulocytes: 1 %
Lymphocytes Absolute: 1.7 10*3/uL (ref 0.7–3.1)
Lymphs: 33 %
MCH: 31.1 pg (ref 26.6–33.0)
MCHC: 35.5 g/dL (ref 31.5–35.7)
MCV: 88 fL (ref 79–97)
Monocytes Absolute: 0.4 10*3/uL (ref 0.1–0.9)
Monocytes: 7 %
Neutrophils Absolute: 2.9 10*3/uL (ref 1.4–7.0)
Neutrophils: 56 %
Platelets: 202 10*3/uL (ref 150–450)
RBC: 5.46 x10E6/uL (ref 4.14–5.80)
RDW: 12.3 % (ref 11.6–15.4)
WBC: 5.2 10*3/uL (ref 3.4–10.8)

## 2021-07-15 LAB — COMPREHENSIVE METABOLIC PANEL
ALT: 29 IU/L (ref 0–44)
AST: 28 IU/L (ref 0–40)
Albumin/Globulin Ratio: 2.4 — ABNORMAL HIGH (ref 1.2–2.2)
Albumin: 4.8 g/dL (ref 4.1–5.2)
Alkaline Phosphatase: 91 IU/L (ref 44–121)
BUN/Creatinine Ratio: 16 (ref 9–20)
BUN: 13 mg/dL (ref 6–20)
Bilirubin Total: 0.5 mg/dL (ref 0.0–1.2)
CO2: 23 mmol/L (ref 20–29)
Calcium: 9.3 mg/dL (ref 8.7–10.2)
Chloride: 103 mmol/L (ref 96–106)
Creatinine, Ser: 0.82 mg/dL (ref 0.76–1.27)
Globulin, Total: 2 g/dL (ref 1.5–4.5)
Glucose: 83 mg/dL (ref 70–99)
Potassium: 4.2 mmol/L (ref 3.5–5.2)
Sodium: 142 mmol/L (ref 134–144)
Total Protein: 6.8 g/dL (ref 6.0–8.5)
eGFR: 126 mL/min/{1.73_m2} (ref >59)

## 2021-07-15 LAB — LIPID PANEL
Chol/HDL Ratio: 3.5 ratio (ref 0.0–5.0)
Cholesterol, Total: 191 mg/dL (ref 100–199)
HDL: 55 mg/dL (ref >39)
LDL Chol Calc (NIH): 123 mg/dL — ABNORMAL HIGH (ref 0–99)
Triglycerides: 70 mg/dL (ref 0–149)
VLDL Cholesterol Cal: 13 mg/dL (ref 5–40)

## 2021-08-04 ENCOUNTER — Other Ambulatory Visit: Payer: Self-pay

## 2021-08-08 ENCOUNTER — Other Ambulatory Visit: Payer: Self-pay

## 2021-10-05 ENCOUNTER — Other Ambulatory Visit: Payer: Self-pay

## 2021-11-15 ENCOUNTER — Encounter (INDEPENDENT_AMBULATORY_CARE_PROVIDER_SITE_OTHER): Payer: Self-pay

## 2021-11-27 ENCOUNTER — Other Ambulatory Visit: Payer: Self-pay

## 2021-11-30 IMAGING — CR DG LUMBAR SPINE COMPLETE 4+V
5 series · 5 of 5 positions shown · non-contrast
Comparison: Lumbar spine x-rays dated April 25, 2019.

CLINICAL DATA: Chronic low back pain.

EXAM:
LUMBAR SPINE - COMPLETE 4+ VIEW

[l-spine ap]
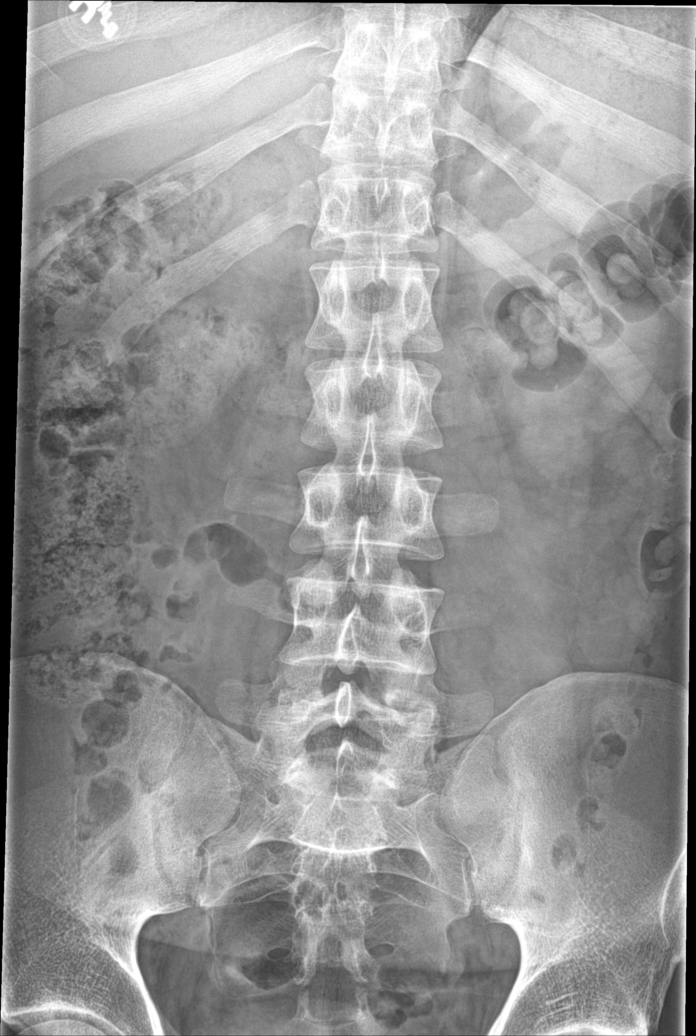

[l-spine obl (1 of 2)]
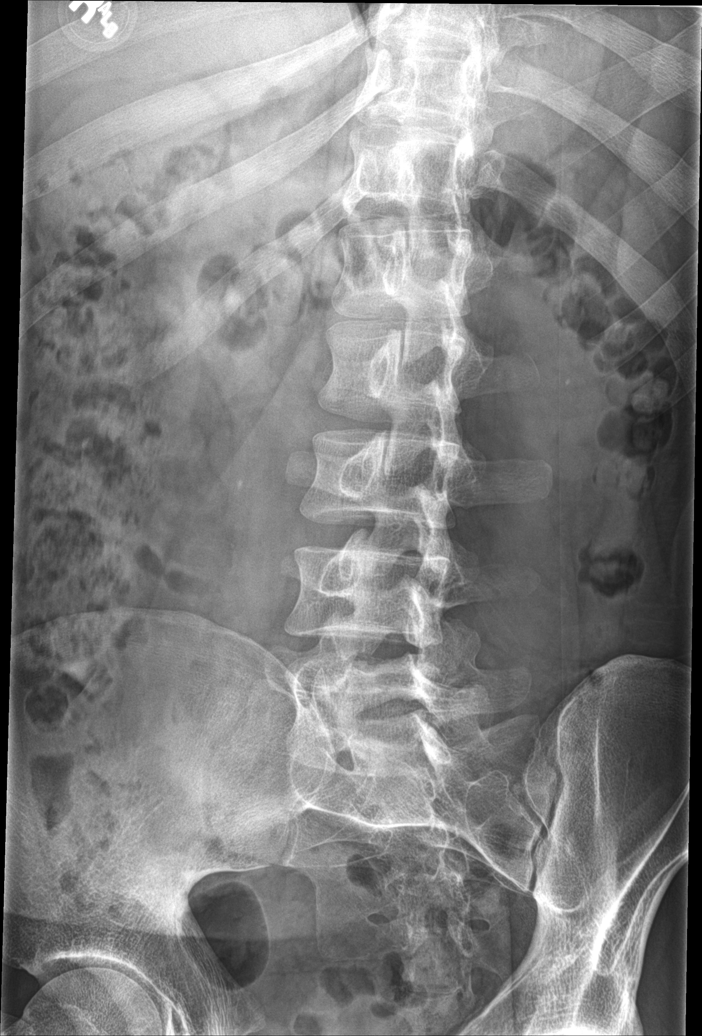

[l-spine obl (2 of 2)]
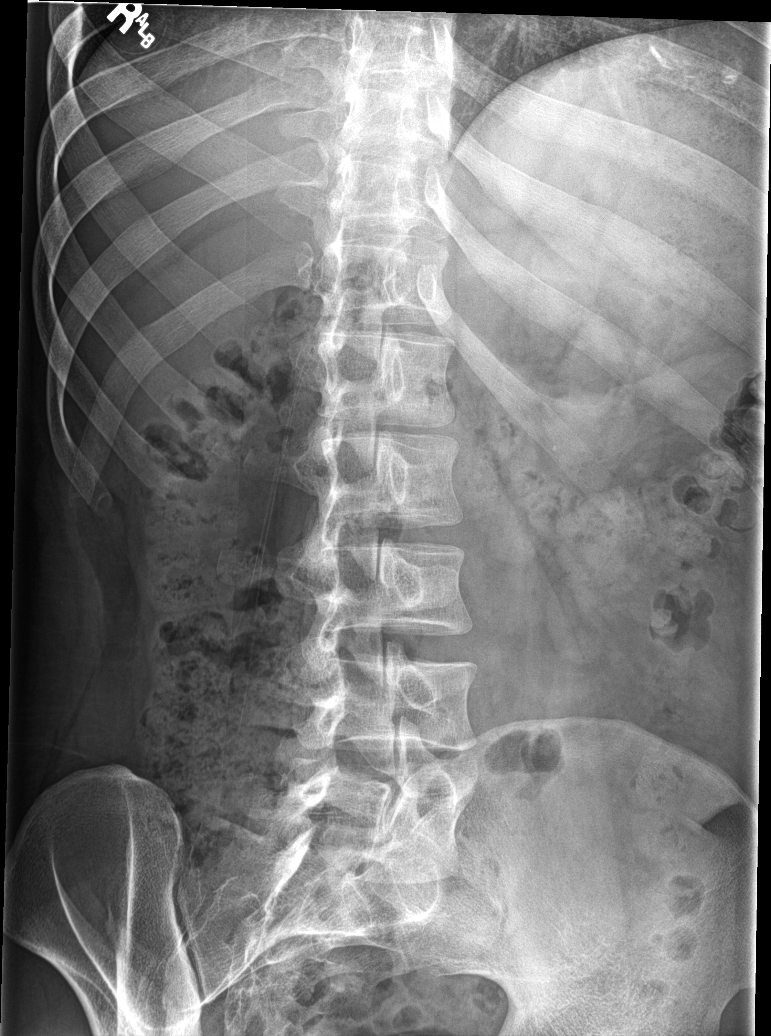

[l-spine lat]
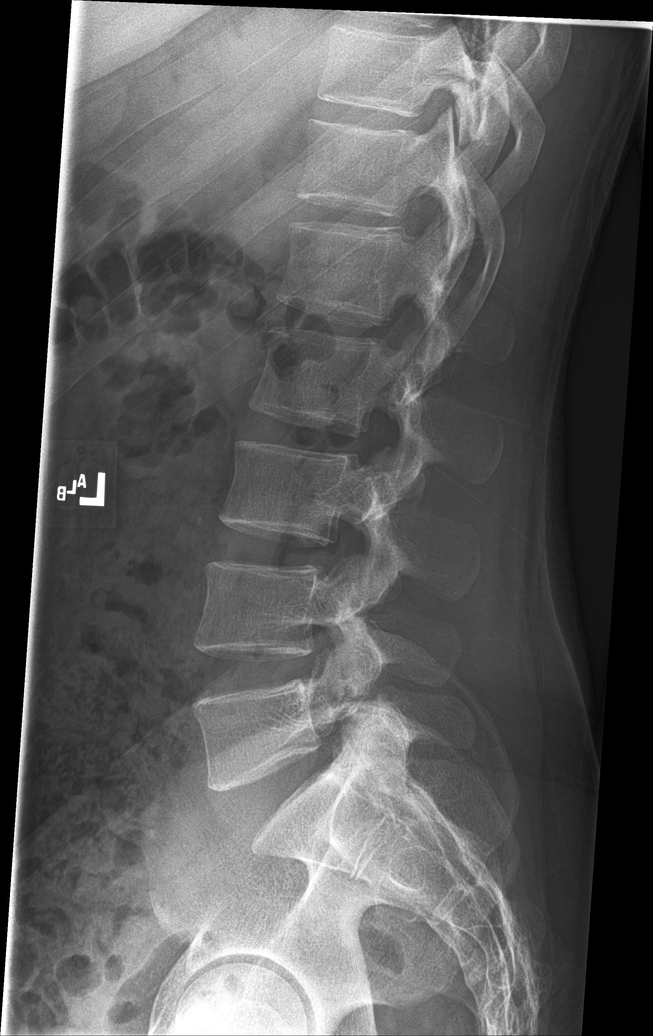

[l-spine spot]
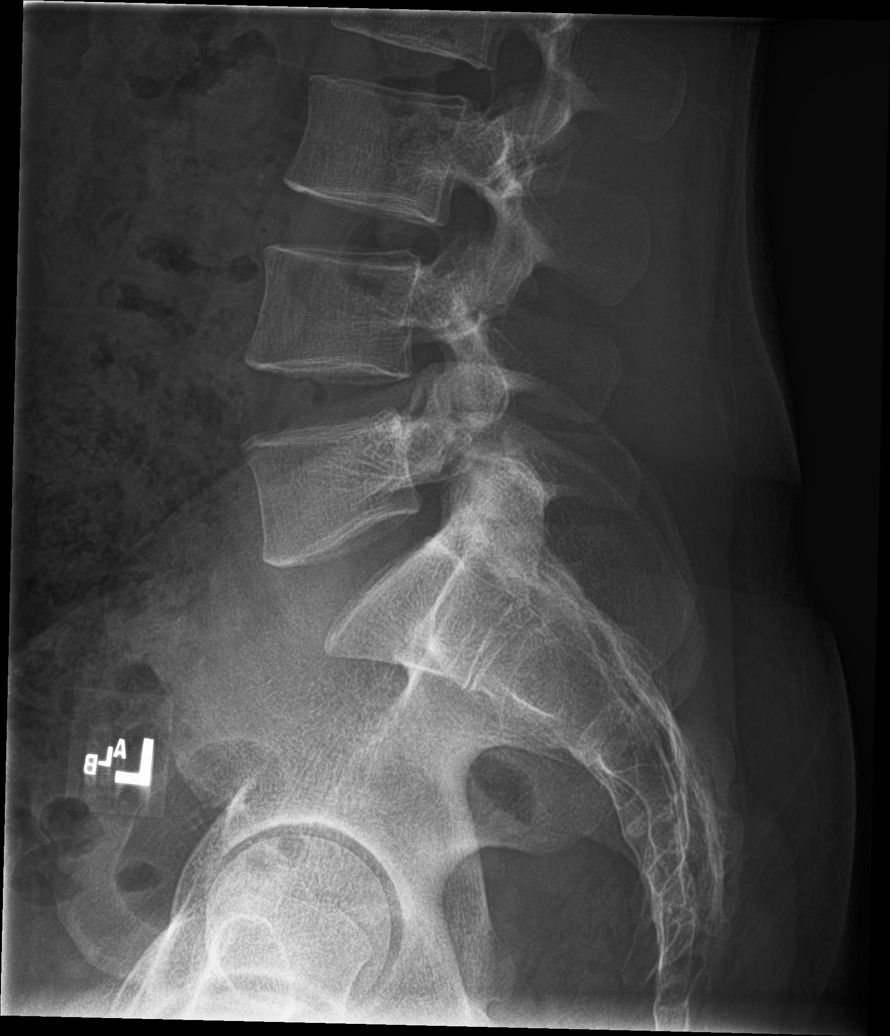

[5 of 5 positions shown; findings below may reference images not displayed]

FINDINGS: Five lumbar type vertebral bodies.

No acute fracture or subluxation. Vertebral body heights are
preserved.

Alignment is normal.  Chronic bilateral L5 pars defects again noted.

Intervertebral disc spaces are maintained.

The sacroiliac joints are unremarkable.
IMPRESSION: 1. No acute osseous abnormality or significant degenerative changes.
2. Chronic bilateral L5 pars defects without listhesis.

## 2022-01-25 ENCOUNTER — Other Ambulatory Visit: Payer: Self-pay

## 2022-02-13 ENCOUNTER — Other Ambulatory Visit: Payer: Self-pay

## 2022-02-28 ENCOUNTER — Other Ambulatory Visit: Payer: Self-pay

## 2022-03-12 ENCOUNTER — Other Ambulatory Visit: Payer: Self-pay

## 2022-04-20 ENCOUNTER — Other Ambulatory Visit: Payer: Self-pay

## 2022-05-07 ENCOUNTER — Ambulatory Visit (INDEPENDENT_AMBULATORY_CARE_PROVIDER_SITE_OTHER): Payer: BC Managed Care – PPO | Admitting: Internal Medicine

## 2022-05-07 ENCOUNTER — Encounter: Payer: Self-pay | Admitting: Internal Medicine

## 2022-05-07 VITALS — BP 134/80 | HR 71 | Ht 72.0 in | Wt 227.0 lb

## 2022-05-07 DIAGNOSIS — I359 Nonrheumatic aortic valve disorder, unspecified: Secondary | ICD-10-CM | POA: Insufficient documentation

## 2022-05-07 DIAGNOSIS — Q453 Other congenital malformations of pancreas and pancreatic duct: Secondary | ICD-10-CM

## 2022-05-07 DIAGNOSIS — N200 Calculus of kidney: Secondary | ICD-10-CM | POA: Diagnosis not present

## 2022-05-07 NOTE — Assessment & Plan Note (Signed)
Fatty atrophy of the pancreatic head Recommend limiting alcohol intake Consider further work up

## 2022-05-07 NOTE — Assessment & Plan Note (Signed)
Seen on CT in ED today Will refer to Cardiology Last lipids mildly elevated

## 2022-05-07 NOTE — Progress Notes (Signed)
Date:  05/07/2022   Name:  Christopher White [facility]   DOB:  03-26-1996   MRN:  [id number]   Chief Complaint: Nephrolithiasis (Feeling better after hospital visit. Patient has not picked up medication.)  HPI Right ureteral stone - seen in [facility] ED this am with a mid ureteral stone and punctate stones in both kidneys.  The pain has now resolved so suspect he passed the stone.  Aortic annular calcifications - seen on CT today.  Family history of CAD.  Lipids last year were mildly elevated.  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  Unsure of the significance.  He does consume liquor 3-4 drinks several times per week.  No diarrhea, no hx of elevated blood sugar.   CT Abd/pelvis: 0.3 cm stone noted within the right mid ureter with mild upstream ureteral dilation without right-sided hydronephrosis. There is mild right-sided periureteral inflammatory stranding which may be reactive secondary to passing stone. Recommend correlation with urinalysis to exclude superimposed infection.   Additional nonobstructing bilateral nephrolithiasis.   Aortic annular calcifications, greater than expected for patient's age.   Mild fatty atrophy of the pancreas, particularly the pancreatic head, greater than expected for patient age.   Lab Results  Component Value Date   NA 142 07/14/2021   K 4.2 07/14/2021   CO2 23 07/14/2021   GLUCOSE 83 07/14/2021   BUN 13 07/14/2021   CREATININE 0.82 07/14/2021   CALCIUM 9.3 07/14/2021   EGFR 126 07/14/2021   GFRNONAA >60 12/08/2019   Lab Results  Component Value Date   CHOL 191 07/14/2021   HDL 55 07/14/2021   LDLCALC 123 (H) 07/14/2021   TRIG 70 07/14/2021   CHOLHDL 3.5 07/14/2021   No results found for: "TSH" No results found for: "HGBA1C" Lab Results  Component Value Date   WBC 5.2 07/14/2021   HGB 17.0 07/14/2021   HCT 47.9 07/14/2021   MCV 88 07/14/2021   PLT 202 07/14/2021   Lab Results  Component Value Date    ALT 29 07/14/2021   AST 28 07/14/2021   ALKPHOS 91 07/14/2021   BILITOT 0.5 07/14/2021   No results found for: "25OHVITD2", "25OHVITD3", "VD25OH"   Review of Systems  Constitutional:  Negative for chills, fatigue and fever.  Cardiovascular:  Negative for chest pain, palpitations and leg swelling.  Gastrointestinal:  Negative for abdominal pain and diarrhea.  Musculoskeletal:  Positive for back pain.  Neurological:  Negative for dizziness and headaches.  Psychiatric/Behavioral:  Negative for dysphoric mood and sleep disturbance. The patient is not nervous/anxious.     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    No Known Allergies  Past Surgical History:  Procedure Laterality Date   NECK SURGERY  2016   C1-C2 fusion for os odontoideum    Social History   Tobacco Use   Smoking status: Former    Packs/day: 1.50    Years: 1.50    Total pack years: 2.25    Types: Cigarettes    Quit date: 2016    Years since quitting: 8.0   Smokeless tobacco: Former    Types: Chew    Quit date: 2016  Vaping Use   Vaping Use: Every day   Substances:  Nicotine, THC, Flavoring  Substance Use Topics   Alcohol use: Yes    Alcohol/week: 0.0 standard drinks of alcohol   Drug use: No     Medication list has been reviewed and updated.  Current Meds  Medication Sig   albuterol (VENTOLIN HFA) 108 (90 Base) MCG/ACT inhaler INHALE 2 PUFFS BY MOUTH EVERY 6 HOURS AS NEEDED FOR WHEEZING OR SHORTNESS OF BREATH.   naproxen (NAPROSYN) 500 MG tablet Take 500 mg by mouth 2 (two) times daily with a meal.   ondansetron (ZOFRAN-ODT) 4 MG disintegrating tablet Take 4 mg by mouth every 8 (eight) hours as needed.   tamsulosin (FLOMAX) 0.4 MG CAPS capsule  Take 1 capsule by mouth daily.       05/07/2022    2:25 PM 07/14/2021    8:23 AM 10/27/2020    9:43 AM  GAD 7 : Generalized Anxiety Score  Nervous, Anxious, on Edge 0 0 0  Control/stop worrying 0 0 0  Worry too much - different things 0 0 0  Trouble relaxing 0 2 1  Restless 0 2 0  Easily annoyed or irritable 0 1 0  Afraid - awful might happen 0 0 0  Total GAD 7 Score 0 5 1  Anxiety Difficulty Not difficult at all Not difficult at all Not difficult at all       05/07/2022    2:25 PM 07/14/2021    8:23 AM 10/27/2020    9:43 AM  Depression screen PHQ 2/9  Decreased Interest 0 3 1  Down, Depressed, Hopeless 0 0 0  PHQ - 2 Score 0 3 1  Altered sleeping 0 3 1  Tired, decreased energy 0 0 1  Change in appetite 0 0 0  Feeling bad or failure about yourself  0 0 0  Trouble concentrating 0 0 0  Moving slowly or fidgety/restless 0 0 0  Suicidal thoughts 0 0 0  PHQ-9 Score 0 6 3  Difficult doing work/chores Not difficult at all Not difficult at all Somewhat difficult    BP Readings from Last 3 Encounters:  05/07/22 134/80  07/14/21 122/78  10/27/20 138/78    Physical Exam Vitals and nursing note reviewed.  Constitutional:      General: He is not in acute distress.    Appearance: Normal appearance. He is well-developed.  HENT:     Head: Normocephalic and atraumatic.  Cardiovascular:     Rate and Rhythm: Normal rate and regular rhythm.     Heart sounds: No murmur heard. Pulmonary:     Effort: Pulmonary effort is normal. No respiratory distress.     Breath sounds: No wheezing or rhonchi.  Abdominal:     Tenderness: There is no right CVA tenderness or left CVA tenderness.  Musculoskeletal:     Cervical back: Normal range of motion.     Right lower leg: No edema.     Left lower leg: No edema.  Skin:    General: Skin is warm and dry.     Findings: No rash.  Neurological:     Mental Status: He is alert and oriented to person, place, and time.  Psychiatric:        Mood and  Affect: Mood normal.        Behavior: Behavior normal.     Wt Readings from Last 3 Encounters:  05/07/22 227 lb (103 kg)  07/14/21 210 lb (95.3 kg)  10/27/20 204 lb (92.5 kg)    BP 134/80   Pulse  71   Ht 6' (1.829 m)   Wt 227 lb (103 kg)   SpO2 97%   BMI 30.79 kg/m   Assessment and Plan: Problem List Items Addressed This Visit       Cardiovascular and Mediastinum   Aortic annular calcification - Primary (Chronic)    Seen on CT in ED today Will refer to Cardiology Last lipids mildly elevated      Relevant Orders   Ambulatory referral to Cardiology     Digestive   Pancreatic abnormality (Chronic)    Fatty atrophy of the pancreatic head Recommend limiting alcohol intake Consider further work up       Other Visit Diagnoses     Right renal stone       I believe that he has now passed the stone continue hydration        Partially dictated using Editor, commissioning. Any errors are unintentional.  Halina Maidens, MD [facility]  05/07/2022

## 2022-06-22 ENCOUNTER — Other Ambulatory Visit: Payer: Self-pay

## 2022-08-16 ENCOUNTER — Other Ambulatory Visit: Payer: Self-pay

## 2024-02-24 ENCOUNTER — Telehealth: Payer: Self-pay | Admitting: *Deleted

## 2024-02-24 DIAGNOSIS — I359 Nonrheumatic aortic valve disorder, unspecified: Secondary | ICD-10-CM

## 2024-02-24 NOTE — Progress Notes (Signed)
 Complex Care Management Note Care Guide Note  02/24/2024 Name: Christopher White [facility] MRN: [id number] DOB: 06-Apr-1996   Complex Care Management Outreach Attempts: An unsuccessful telephone outreach was attempted today to offer the patient information about available complex care management services.  Follow Up Plan:  Additional outreach attempts will be made to offer the patient complex care management information and services.   Encounter Outcome:  No Answer  Thedford Franks, CMA [place]  [facility], [facility] Guide Direct Dial: [phone]  Fax: [phone] Website: [email or web address]

## 2024-02-27 NOTE — Progress Notes (Signed)
 Complex Care Management Note Care Guide Note  02/27/2024 Name: Christopher White [facility] MRN: [id number] DOB: 1995/06/27   Complex Care Management Outreach Attempts: A second unsuccessful outreach was attempted today to offer the patient with information about available complex care management services.  Follow Up Plan:  Additional outreach attempts will be made to offer the patient complex care management information and services.   Encounter Outcome:  No Answer  Thedford Franks, CMA [place]  [facility], [facility] Guide Direct Dial: [phone]  Fax: [phone] Website: [email or web address]

## 2024-02-28 NOTE — Progress Notes (Signed)
 Complex Care Management Note  Care Guide Note 02/28/2024 Name: Christopher White [facility] MRN: [id number] DOB: 1995/04/30  Christopher White is a 28 y.o. year old male who sees Justus Leita DEL, MD for primary care. I reached out to [facility] by phone today to offer complex care management services.  Mr. Thain was given information about Complex Care Management services today including:   The Complex Care Management services include support from the care team which includes your Nurse Care Manager, Clinical Social Worker, or Pharmacist.  The Complex Care Management team is here to help remove barriers to the health concerns and goals most important to you. Complex Care Management services are voluntary, and the patient may decline or stop services at any time by request to their care team member.   Complex Care Management Consent Status: Patient did not agree to participate in complex care management services at this time.  Encounter Outcome:  Patient Refused  Thedford Franks, CMA [place]  [facility], [facility] Guide Direct Dial: [phone]  Fax: [phone] Website: [email or web address]
